# Patient Record
Sex: Male | Born: 1970 | Race: White | Hispanic: No | Marital: Married | Smoking: Current every day smoker
Health system: Southern US, Community
[De-identification: ages and names within clinical notes are randomized; demographics above are authoritative.]

## PROBLEM LIST (undated history)

## (undated) DIAGNOSIS — F410 Panic disorder [episodic paroxysmal anxiety] without agoraphobia: Secondary | ICD-10-CM

## (undated) DIAGNOSIS — F1010 Alcohol abuse, uncomplicated: Secondary | ICD-10-CM

## (undated) DIAGNOSIS — J449 Chronic obstructive pulmonary disease, unspecified: Secondary | ICD-10-CM

## (undated) DIAGNOSIS — I10 Essential (primary) hypertension: Secondary | ICD-10-CM

## (undated) DIAGNOSIS — F419 Anxiety disorder, unspecified: Secondary | ICD-10-CM

## (undated) HISTORY — DX: Chronic obstructive pulmonary disease, unspecified: J44.9

## (undated) HISTORY — PX: EYE SURGERY: SHX253

## (undated) HISTORY — PX: EXTERNAL EAR SURGERY: SHX627

---

## 1999-04-09 ENCOUNTER — Emergency Department (HOSPITAL_COMMUNITY): Admission: EM | Admit: 1999-04-09 | Discharge: 1999-04-09 | Payer: Self-pay | Admitting: Emergency Medicine

## 2000-04-13 ENCOUNTER — Emergency Department (HOSPITAL_COMMUNITY): Admission: EM | Admit: 2000-04-13 | Discharge: 2000-04-13 | Payer: Self-pay | Admitting: Emergency Medicine

## 2006-09-30 ENCOUNTER — Ambulatory Visit: Payer: Self-pay | Admitting: Psychiatry

## 2006-09-30 ENCOUNTER — Inpatient Hospital Stay (HOSPITAL_COMMUNITY): Admission: EM | Admit: 2006-09-30 | Discharge: 2006-10-03 | Payer: Self-pay | Admitting: Psychiatry

## 2007-01-25 ENCOUNTER — Ambulatory Visit: Payer: Self-pay | Admitting: Psychiatry

## 2007-01-25 ENCOUNTER — Inpatient Hospital Stay (HOSPITAL_COMMUNITY): Admission: RE | Admit: 2007-01-25 | Discharge: 2007-01-28 | Payer: Self-pay | Admitting: Psychiatry

## 2010-12-02 NOTE — Discharge Summary (Signed)
NAME:  BEECHER, FURIO NO.:  [id number]   MEDICAL RECORD NO.:  [id number]          PATIENT TYPE:  IPS   LOCATION:  0503                          FACILITY:  BH   PHYSICIAN:  Geoffery Lyons, M.D.      DATE OF BIRTH:  May 05, 1971   DATE OF ADMISSION:  09/30/2006  DATE OF DISCHARGE:  10/03/2006                               DISCHARGE SUMMARY   CHIEF COMPLAINT AND PRESENT ILLNESS:  This was the first admission to  [facility] for this 40 year old separated male who was  having thoughts about killing himself, trying to hang himself with a  leash, told his nurse that he would blow his head off.  When he  attempted to leave the hospital the patient was petitioned.  He had been  drinking 12 beers a night.  Prior to this admission he was drinking only  once a week.  Apparently his wife left him approximately a week ago and  he is currently unemployed.   PAST PSYCHIATRIC HISTORY:  The first time at [facility].  He has  been on Prozac in the past.   SOCIAL HISTORY:  Drinking approximately 12 beers at night.  No other  substances.   MEDICAL HISTORY:  Hypertension, gastroesophageal reflux.   MEDICATION:  Toprol XL 150, Ativan 1 mg twice a day.   PHYSICAL EXAMINATION:  Physical examination performed and failed to show  any acute findings.   LABORATORY WORK:  Sodium 142, potassium 3.6, glucose 85, BUN 5,  creatinine 0.93, SGOT 27, SGPT 30.  Drug screening negative for  substances of abuse.  CBC: White blood cells 8.0, hemoglobin 16.5.   MENTAL STATUS EXAM:  Reveals alert cooperative male somewhat sedated.  Thought process clear, no active delusions.  No suicidal or homicidal  ideas, no hallucinations.  Cognition well-preserved.   ADMISSION DIAGNOSIS:  AXIS I: Rule out major depressive disorder,  alcohol abuse.  AXIS II: No diagnosis.  AXIS III:  Hypertension.  Gastroesophageal reflux.  AXIS IV: Moderate.  AXIS V:  Upon admission 30, highest in  the last year 60.   COURSE IN THE HOSPITAL:  He was admitted.  He was started in individual  and group psychotherapy.  He endorsed he has been very depressed for the  last 3-4 days.  He is a Technical sales engineer, unemployed five or six  months, financially struggling.  His wife left 1 week prior to this  admission, very upset, conflict with the wife.  A 13-year relationship.  Has been apart for 2 weeks in the past.  Endorsed anger, rage, worries.  He was on Prozac a long time ago.  He felt that it initially sped him up  and then made him feel like a zombie.  Alcohol 6-12 beers on weekends.  Used to be a heavy drinker, did slow down.  No drugs.  By March 18 he  was better.  He found out that the wife was in the women's shelter.  Felt that he was ready to move on either with her or without her.  Committed to doing better.  Sleep  got better.  Mood improved.  Affect  was brighter.  Endorsed that he needed to get out and find a job.  On  March19 he was in full contact with reality.  There were no suicidal or  homicidal ideas, no hallucinations or delusions.  He was objectively  better.  He felt that he was ready to move on.  He was going to go home  with his parents.  Will work on himself, work on finding a job, get  stronger.  If his wife was to decide to come back he would be okay with  it, if not he was ready to move on.   DISCHARGE DIAGNOSES:  AXIS I: Major depression, anxiety disorder, not  otherwise specified.  Alcohol abuse.  AXIS II: No diagnosis.  AXIS III:  Hypertension, gastroesophageal reflux disease.  AXIS IV: Moderate.  AXIS V:  On discharge 55-60.   MEDICATIONS:  Discharged on Lexapro 10 mg per day, Ativan 1 mg twice a  day.  Decrease to 0.5 twice a day with the physicians.  Toprol XL 50 mg  per day.  To abstain from alcohol.   FOLLOW UP:  [facility] in [place].      Geoffery Lyons, M.D.  Electronically Signed     IL/MEDQ  D:  10/30/2006  T:  10/31/2006   Job:  [id number]

## 2010-12-02 NOTE — Discharge Summary (Signed)
NAME:  [name], [name] NO.:  [id number]   MEDICAL RECORD NO.:  [id number]          PATIENT TYPE:  IPS   LOCATION:  0505                          FACILITY:  BH   PHYSICIAN:  Anselm Jungling, MD  DATE OF BIRTH:  04-17-1971   DATE OF ADMISSION:  01/25/2007  DATE OF DISCHARGE:  01/28/2007                               DISCHARGE SUMMARY   IDENTIFYING DATA/REASON FOR ADMISSION:  This was an inpatient  psychiatric admission for Vincent Johnson, a 40 year old male who was admitted due  to depression, in the aftermath of an overdose.  He presented with  joblessness, and financial problems.  He had been living with his  parents.  His spouse had abandoned the home two weeks prior.  He also  admitted to alcohol abuse.  Please refer to the admission note for  further details pertaining to the symptoms, circumstances and history  that led to his hospitalization.   INITIAL DIAGNOSTIC IMPRESSION:  He was given initial AXIS I diagnoses of  polysubstance dependence and rule out depressive disorder not otherwise  specified.   MEDICAL/LABORATORY:  The patient was medically and physically assessed  by the psychiatric nurse practitioner.  There were no acute medical  issues.  He did appear to have an ear infection and was given  Cortisporin otic drops twice daily, and instructed to continue this  through February 05, 2007.   HOSPITAL COURSE:  The patient was admitted to the adult inpatient  service.  He presented as a disheveled, but cooperative male who readily  discussed the stressors in his life.  He denied any further suicidal  ideation and verbalized a strong desire for help.   He was placed on a Librium detoxification protocol.  He was involved in  various therapeutic groups and activities including those geared towards  12-step recovery.   He did relatively well with his detoxification protocol, but on the  fourth hospital day indicated in eagerness to leave the inpatient  setting.   He denied suicidal ideation.  We had talked of having a family  conference with his parents and brother prior to discharge, but he was  unwilling to wait for this to occur.  He was discharged earlier than we  would have preferred, at his own request.   AFTERCARE:  The patient was to follow up at the [facility] with  an appointment on the following day, January 29, 2007.   DISCHARGE MEDICATIONS:  Cortisporin otic drops, 3 drops each ear twice  daily through February 05, 2007.  The patient was instructed to follow up  with his family doctor for any medical issues.   DISCHARGE DIAGNOSES:  AXIS I:  Polysubstance dependence.  Mood disorder  not otherwise specified.  AXIS II:  Deferred.  AXIS III:  Ear infection.  AXIS IV:  Stressors:  Severe.  AXIS V:  GAF on discharge 50.      Anselm Jungling, MD  Electronically Signed     SPB/MEDQ  D:  02/15/2007  T:  02/15/2007  Job:  [id number]

## 2010-12-02 NOTE — H&P (Signed)
NAME:  Vincent Johnson, Vincent Johnson NO.:  [id number]   MEDICAL RECORD NO.:  [id number]          PATIENT TYPE:  IPS   LOCATION:  0503                          FACILITY:  BH   PHYSICIAN:  Geoffery Lyons, M.D.      DATE OF BIRTH:  [birth year]   DATE OF ADMISSION:  09/30/2006  DATE OF DISCHARGE:                       PSYCHIATRIC ADMISSION ASSESSMENT   IDENTIFYING INFORMATION:  The patient is a 40 year old separated  involuntary committed on September 30, 2006.  The patient is here on  petition.  Papers state that the patient was having thoughts about  killing himself, trying to hang himself with a leash, told his nurse  that he would blow his head off.  When he attempted to leave the  hospital, the patient was petitioned.  The patient has been drinking  approximately 12 beers the night prior to this admission otherwise  drinking is only once a week.  The patient's stressors are his wife left  him approximately a week ago and he is currently unemployed.   PAST PSYCHIATRIC HISTORY:  First admission to the [facility].  Was on Prozac in the past.   SOCIAL HISTORY:  This is a 41 year old separated white male who  currently lives with his mother.  He has been married for 13 years.  He  is unemployed.  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   FAMILY HISTORY:  None.   ALCOHOL/DRUG HISTORY:  Alcohol and drug habits as above.  There is no  current drug use.   PRIMARY CARE PHYSICIAN:  Unclear.   MEDICAL PROBLEMS:  Hypertension and reflux.   MEDICATIONS:  Has been on Toprol XL 150 mg daily and Ativan 1 mg b.i.d.   ALLERGIES:  No known allergies.   PHYSICAL EXAMINATION:  The patient was fully assessed at [facility]  Temperature is 97.9, heart rate 139, respirations 20, blood  pressure 172/120, with weight of 189 pounds.   LABORATORY DATA:  Alcohol level is 200.  CMET is within normal limits.  Urine drug screen was negative.  CBC within normal limits.  Acetaminophen level less than 10.   MENTAL STATUS EXAM:  He is in the bed.  He is sedated, very difficult to  arouse.  His thought processes and mood and cognitive function are  unable to ascertain this time.   DIAGNOSES:  AXIS I:  Major depressive disorder.  Anxiety disorder.  Rule  out alcohol abuse.  AXIS II:  Deferred.  AXIS III:  Hypertension, reflux.  AXIS IV:  Problems with primary support group, occupation, possible  financial problems, other psychosocial problems and medical problems.  AXIS V:  Current 30.   PLAN:  Contract for safety.  Stabilize mood and thinking.  We will at  this time continue with his Ativan and continue to monitor the patient's  substance abuse.  Will initiate a small dose of Lexapro as the patient  had some concerns about SSRIs.  Will consider a family session with his  wife.  The patient is to follow up with his primary care Madeliene Tejera in  regards to  his medical problems.   TENTATIVE LENGTH OF STAY:  Five to seven days.      Landry Corporal, N.P.      Geoffery Lyons, M.D.  Electronically Signed    JO/MEDQ  D:  10/02/2006  T:  10/02/2006  Job:  [id number]

## 2012-02-26 ENCOUNTER — Emergency Department (HOSPITAL_COMMUNITY)
Admission: EM | Admit: 2012-02-26 | Discharge: 2012-02-26 | Disposition: A | Discharge status: Home / Self Care | Payer: Medicare Other | Attending: Emergency Medicine | Admitting: Emergency Medicine

## 2012-02-26 ENCOUNTER — Encounter (HOSPITAL_COMMUNITY): Payer: Self-pay | Admitting: Family Medicine

## 2012-02-26 DIAGNOSIS — I10 Essential (primary) hypertension: Secondary | ICD-10-CM | POA: Insufficient documentation

## 2012-02-26 DIAGNOSIS — F1320 Sedative, hypnotic or anxiolytic dependence, uncomplicated: Secondary | ICD-10-CM | POA: Insufficient documentation

## 2012-02-26 DIAGNOSIS — F1310 Sedative, hypnotic or anxiolytic abuse, uncomplicated: Secondary | ICD-10-CM

## 2012-02-26 DIAGNOSIS — F411 Generalized anxiety disorder: Secondary | ICD-10-CM | POA: Insufficient documentation

## 2012-02-26 DIAGNOSIS — F419 Anxiety disorder, unspecified: Secondary | ICD-10-CM

## 2012-02-26 DIAGNOSIS — F17200 Nicotine dependence, unspecified, uncomplicated: Secondary | ICD-10-CM | POA: Insufficient documentation

## 2012-02-26 HISTORY — DX: Essential (primary) hypertension: I10

## 2012-02-26 HISTORY — DX: Panic disorder (episodic paroxysmal anxiety): F41.0

## 2012-02-26 MED ORDER — LORAZEPAM 1 MG PO TABS
2.0000 mg | ORAL_TABLET | Freq: Once | ORAL | Status: AC
  Administered 2012-02-26: 2 mg via ORAL
  Filled 2012-02-26: qty 2

## 2012-02-26 MED ORDER — CLONAZEPAM 0.5 MG PO TABS: 0.5000 mg | ORAL_TABLET | Freq: Two times a day (BID) | ORAL | Status: DC | PRN

## 2012-02-26 NOTE — ED Notes (Signed)
Pt reports having panic attacks one after another this morning. Reports this is his third day without ativan.  Denies SI/HI or hallucinations at this time.

## 2012-02-26 NOTE — ED Provider Notes (Signed)
History     CSN: [id number]  Arrival date & time 02/26/12  1342   First MD Initiated Contact with Patient 02/26/12 1555      Chief Complaint  Patient presents with  . Withdrawal    from ativan  . Anxiety    (Consider location/radiation/quality/duration/timing/severity/associated sxs/prior treatment) HPI  Patient presents to the emergency department requesting medication refill. He states that he is out of his Ativan and it has been 3 or 4 days since her last period he states that he is having withdrawal symptoms of feeling really anxious and shaky. He denies having any hallucinations, vomiting, seizures, headaches. He says that his anxiety has been really bad therefore he has been taking more than what is prescribed to him. He states that he gets that from his primary care Doctor. He admits that his refill is deal on Saturday when he will be able to get some more Ativan. He states that he is taking it for 10 years. He denies using any other daily medications for anxiety. He denies having any other symptoms. Vital signs WNL.   Past Medical History  Diagnosis Date  . Panic attacks   . Hypertension     History reviewed. No pertinent past surgical history.  History reviewed. No pertinent family history.  History  Substance Use Topics  . Smoking status: Current Everyday Smoker -- 1.0 packs/day  . Smokeless tobacco: Not on file  . Alcohol Use: Yes      Review of Systems   HEENT: denies blurry vision or change in hearing PULMONARY: Denies difficulty breathing and SOB CARDIAC: denies chest pain or heart palpitations MUSCULOSKELETAL:  denies being unable to ambulate ABDOMEN AL: denies abdominal pain GU: denies loss of bowel or urinary control NEURO: denies numbness and tingling in extremities SKIN: no new rashes PSYCH: +anxiety. NECK: Pt denies having neck pain    Allergies  Review of patient's allergies indicates no known allergies.  Home Medications   Current  Outpatient Rx  Name Route Sig Dispense Refill  . DIPHENHYDRAMINE HCL 25 MG PO TABS Oral Take 25 mg by mouth every 6 (six) hours as needed. Sleep.    Marland Kitchen LISINOPRIL-HYDROCHLOROTHIAZIDE 20-12.5 MG PO TABS Oral Take 1 tablet by mouth daily.    Marland Kitchen LORAZEPAM 2 MG PO TABS Oral Take 2 mg by mouth every 8 (eight) hours as needed. Anxiety.    Marland Kitchen METOPROLOL TARTRATE 100 MG PO TABS Oral Take 100 mg by mouth 2 (two) times daily.    Marland Kitchen OMEPRAZOLE 20 MG PO CPDR Oral Take 20 mg by mouth daily.    Marland Kitchen CLONAZEPAM 0.5 MG PO TABS Oral Take 1 tablet (0.5 mg total) by mouth 2 (two) times daily as needed for anxiety. 9 tablet 0    BP 128/85  Pulse 73  Temp 97.5 F (36.4 C) (Oral)  Resp 18  SpO2 100%  Physical Exam  Nursing note and vitals reviewed. Constitutional: He appears well-developed and well-nourished. No distress.  HENT:  Head: Normocephalic and atraumatic.  Eyes: Pupils are equal, round, and reactive to light.  Neck: Normal range of motion. Neck supple.  Cardiovascular: Normal rate and regular rhythm.   Pulmonary/Chest: Effort normal.  Abdominal: Soft.  Neurological: He is alert.  Skin: Skin is warm and dry.  Psychiatric: His mood appears anxious. He does not exhibit a depressed mood. He expresses no homicidal and no suicidal ideation. He expresses no suicidal plans and no homicidal plans.    ED Course  Procedures (including critical  care time)  Labs Reviewed - No data to display No results found.   1. Anxiety   2. Benzodiazepine abuse       MDM  Patient has been off of his Ativan for almost 4 days. He is not having any true withdrawal symptoms at this time. I have written him a prescription for Klonopin to help him get to Saturday when he hit his refill. He stated that he cannot take his medication more often or a higher dose than what is prescribed. I have discussed with him that he needs to talk to the prescribing physician and let him know that C. needs adjunct therapy as his regimen  now is not sufficient. Patient is agreeable with this understanding. No red flags symptoms noted.  Pt has been advised of the symptoms that warrant their return to the ED. Patient has voiced understanding and has agreed to follow-up with the PCP or specialist.         Dorthula Matas, PA 02/26/12 1623

## 2012-02-27 NOTE — ED Provider Notes (Signed)
Medical screening examination/treatment/procedure(s) were performed by non-physician practitioner and as supervising physician I was immediately available for consultation/collaboration.   Anacleto Batterman M Rosevelt Luu, DO 02/27/12 1521 

## 2015-08-10 DIAGNOSIS — E669 Obesity, unspecified: Secondary | ICD-10-CM | POA: Diagnosis not present

## 2015-08-10 DIAGNOSIS — E1165 Type 2 diabetes mellitus with hyperglycemia: Secondary | ICD-10-CM | POA: Diagnosis not present

## 2015-08-10 DIAGNOSIS — E039 Hypothyroidism, unspecified: Secondary | ICD-10-CM | POA: Diagnosis not present

## 2015-08-10 DIAGNOSIS — I10 Essential (primary) hypertension: Secondary | ICD-10-CM | POA: Diagnosis not present

## 2015-08-10 DIAGNOSIS — F411 Generalized anxiety disorder: Secondary | ICD-10-CM | POA: Diagnosis not present

## 2015-08-10 DIAGNOSIS — E790 Hyperuricemia without signs of inflammatory arthritis and tophaceous disease: Secondary | ICD-10-CM | POA: Diagnosis not present

## 2015-08-10 DIAGNOSIS — E782 Mixed hyperlipidemia: Secondary | ICD-10-CM | POA: Diagnosis not present

## 2015-08-10 DIAGNOSIS — R252 Cramp and spasm: Secondary | ICD-10-CM | POA: Diagnosis not present

## 2015-09-07 DIAGNOSIS — G8929 Other chronic pain: Secondary | ICD-10-CM | POA: Diagnosis not present

## 2015-09-07 DIAGNOSIS — E559 Vitamin D deficiency, unspecified: Secondary | ICD-10-CM | POA: Diagnosis not present

## 2015-09-07 DIAGNOSIS — J309 Allergic rhinitis, unspecified: Secondary | ICD-10-CM | POA: Diagnosis not present

## 2015-09-07 DIAGNOSIS — I10 Essential (primary) hypertension: Secondary | ICD-10-CM | POA: Diagnosis not present

## 2015-09-07 DIAGNOSIS — F411 Generalized anxiety disorder: Secondary | ICD-10-CM | POA: Diagnosis not present

## 2015-10-05 DIAGNOSIS — F411 Generalized anxiety disorder: Secondary | ICD-10-CM | POA: Diagnosis not present

## 2015-10-05 DIAGNOSIS — I10 Essential (primary) hypertension: Secondary | ICD-10-CM | POA: Diagnosis not present

## 2015-10-05 DIAGNOSIS — E669 Obesity, unspecified: Secondary | ICD-10-CM | POA: Diagnosis not present

## 2015-10-05 DIAGNOSIS — G8929 Other chronic pain: Secondary | ICD-10-CM | POA: Diagnosis not present

## 2015-11-03 DIAGNOSIS — I10 Essential (primary) hypertension: Secondary | ICD-10-CM | POA: Diagnosis not present

## 2015-11-03 DIAGNOSIS — G8929 Other chronic pain: Secondary | ICD-10-CM | POA: Diagnosis not present

## 2015-11-03 DIAGNOSIS — E039 Hypothyroidism, unspecified: Secondary | ICD-10-CM | POA: Diagnosis not present

## 2015-11-03 DIAGNOSIS — K219 Gastro-esophageal reflux disease without esophagitis: Secondary | ICD-10-CM | POA: Diagnosis not present

## 2015-11-03 DIAGNOSIS — R05 Cough: Secondary | ICD-10-CM | POA: Diagnosis not present

## 2015-11-03 DIAGNOSIS — R252 Cramp and spasm: Secondary | ICD-10-CM | POA: Diagnosis not present

## 2015-11-03 DIAGNOSIS — F17210 Nicotine dependence, cigarettes, uncomplicated: Secondary | ICD-10-CM | POA: Diagnosis not present

## 2015-11-03 DIAGNOSIS — J069 Acute upper respiratory infection, unspecified: Secondary | ICD-10-CM | POA: Diagnosis not present

## 2015-11-03 DIAGNOSIS — E790 Hyperuricemia without signs of inflammatory arthritis and tophaceous disease: Secondary | ICD-10-CM | POA: Diagnosis not present

## 2015-11-04 DIAGNOSIS — E1165 Type 2 diabetes mellitus with hyperglycemia: Secondary | ICD-10-CM | POA: Diagnosis not present

## 2015-11-04 DIAGNOSIS — E782 Mixed hyperlipidemia: Secondary | ICD-10-CM | POA: Diagnosis not present

## 2015-11-04 DIAGNOSIS — I10 Essential (primary) hypertension: Secondary | ICD-10-CM | POA: Diagnosis not present

## 2015-12-17 DIAGNOSIS — I10 Essential (primary) hypertension: Secondary | ICD-10-CM | POA: Diagnosis not present

## 2015-12-17 DIAGNOSIS — F419 Anxiety disorder, unspecified: Secondary | ICD-10-CM | POA: Diagnosis not present

## 2015-12-21 DIAGNOSIS — F419 Anxiety disorder, unspecified: Secondary | ICD-10-CM | POA: Diagnosis not present

## 2015-12-21 DIAGNOSIS — Z760 Encounter for issue of repeat prescription: Secondary | ICD-10-CM | POA: Diagnosis not present

## 2016-02-16 ENCOUNTER — Ambulatory Visit (HOSPITAL_COMMUNITY)
Admission: EM | Admit: 2016-02-16 | Discharge: 2016-02-16 | Disposition: A | Discharge status: Home / Self Care | Payer: Medicare Other | Attending: Family Medicine | Admitting: Family Medicine

## 2016-02-16 ENCOUNTER — Encounter (HOSPITAL_COMMUNITY): Payer: Self-pay | Admitting: Emergency Medicine

## 2016-02-16 DIAGNOSIS — R0602 Shortness of breath: Secondary | ICD-10-CM | POA: Diagnosis not present

## 2016-02-16 DIAGNOSIS — Z760 Encounter for issue of repeat prescription: Secondary | ICD-10-CM

## 2016-02-16 MED ORDER — LORAZEPAM 1 MG PO TABS: 1.0000 mg | ORAL_TABLET | Freq: Three times a day (TID) | ORAL | 0 refills | Status: DC | PRN

## 2016-02-16 MED ORDER — TRIAMCINOLONE ACETONIDE 40 MG/ML IJ SUSP
40.0000 mg | Freq: Once | INTRAMUSCULAR | Status: AC
  Administered 2016-02-16: 40 mg via INTRAMUSCULAR

## 2016-02-16 MED ORDER — TRIAMCINOLONE ACETONIDE 40 MG/ML IJ SUSP
INTRAMUSCULAR | Status: AC
  Filled 2016-02-16: qty 1

## 2016-02-16 MED ORDER — METHYLPREDNISOLONE ACETATE 80 MG/ML IJ SUSP
INTRAMUSCULAR | Status: AC
Start: 2016-02-16 — End: 2016-02-16
  Filled 2016-02-16: qty 1

## 2016-02-16 MED ORDER — METHYLPREDNISOLONE ACETATE 40 MG/ML IJ SUSP
80.0000 mg | Freq: Once | INTRAMUSCULAR | Status: AC
  Administered 2016-02-16: 80 mg via INTRAMUSCULAR

## 2016-02-16 NOTE — Discharge Instructions (Signed)
See your doctor at [facility] for further  refills.

## 2016-02-16 NOTE — ED Provider Notes (Signed)
[place]    CSN: [id number] Arrival date & time: 02/16/16  1131  First Provider Contact:  First MD Initiated Contact with Patient 02/16/16 1301        History   Chief Complaint Chief Complaint  Patient presents with  . Shortness of Breath  . Medication Refill    HPI Vincent Johnson is a 45 y.o. male.   The history is provided by the patient.  Shortness of Breath  Severity:  Mild Onset quality:  Gradual Timing:  Intermittent Progression:  Unchanged Chronicity:  Chronic Context: weather changes   Relieved by:  Nothing Worsened by:  Nothing Associated symptoms: no fever and no wheezing   Associated symptoms comment:  Pt without lmd and is now out of meds, has mult med issues .requesting all of meds but told needs to come from primary doctor.  Medication Refill    Past Medical History:  Diagnosis Date  . Hypertension   . Panic attacks     There are no active problems to display for this patient.   No past surgical history on file.     Home Medications    Prior to Admission medications   Medication Sig Start Date End Date Taking? Authorizing Provider  clonazePAM (KLONOPIN) 0.5 MG tablet Take 1 tablet (0.5 mg total) by mouth 2 (two) times daily as needed for anxiety. 02/26/12 03/27/12  Tiffany Carlota Raspberry, PA-C  diphenhydrAMINE (BENADRYL) 25 MG tablet Take 25 mg by mouth every 6 (six) hours as needed. Sleep.    Historical Provider, MD  lisinopril-hydrochlorothiazide (PRINZIDE,ZESTORETIC) 20-12.5 MG per tablet Take 1 tablet by mouth daily.    Historical Provider, MD  LORazepam (ATIVAN) 2 MG tablet Take 2 mg by mouth every 8 (eight) hours as needed. Anxiety.    Historical Provider, MD  metoprolol (LOPRESSOR) 100 MG tablet Take 100 mg by mouth 2 (two) times daily.    Historical Provider, MD  omeprazole (PRILOSEC) 20 MG capsule Take 20 mg by mouth daily.    Historical Provider, MD    Family History No family history on file.  Social History Social History   Substance Use Topics  . Smoking status: Current Every Day Smoker    Packs/day: 1.00  . Smokeless tobacco: Not on file  . Alcohol use Yes     Allergies   Review of patient's allergies indicates no known allergies.   Review of Systems Review of Systems  Constitutional: Negative for fever.  HENT: Negative.   Respiratory: Positive for shortness of breath. Negative for wheezing.   Cardiovascular: Negative.   Psychiatric/Behavioral: The patient is nervous/anxious.   All other systems reviewed and are negative.    Physical Exam Triage Vital Signs ED Triage Vitals [02/16/16 1242]  Enc Vitals Group     BP 139/83     Pulse Rate 72     Resp 16     Temp 98.2 F (36.8 C)     Temp Source Oral     SpO2 95 %     Weight      Height      Head Circumference      Peak Flow      Pain Score      Pain Loc      Pain Edu?      Excl. in [place]?    No data found.   Updated Vital Signs BP 139/83 (BP Location: Left Arm)   Pulse 72   Temp 98.2 F (36.8 C) (Oral)   Resp 16  SpO2 95%   Visual Acuity Right Eye Distance:   Left Eye Distance:   Bilateral Distance:    Right Eye Near:   Left Eye Near:    Bilateral Near:     Physical Exam  Constitutional: He is oriented to person, place, and time. He appears well-developed and well-nourished.  Neck: Normal range of motion. Neck supple.  Cardiovascular: Normal rate, regular rhythm, normal heart sounds and intact distal pulses.   Pulmonary/Chest: Effort normal and breath sounds normal.  Lymphadenopathy:    He has no cervical adenopathy.  Neurological: He is alert and oriented to person, place, and time.  Skin: Skin is warm and dry.  Nursing note and vitals reviewed.    UC Treatments / Results  Labs (all labs ordered are listed, but only abnormal results are displayed) Labs Reviewed - No data to display  EKG  EKG Interpretation None       Radiology No results found.  Procedures Procedures (including critical care  time)  Medications Ordered in UC Medications - No data to display   Initial Impression / Assessment and Plan / UC Course  I have reviewed the triage vital signs and the nursing notes.  Pertinent labs & imaging results that were available during my care of the patient were reviewed by me and considered in my medical decision making (see chart for details).  Clinical Course      Final Clinical Impressions(s) / UC Diagnoses   Final diagnoses:  None    New Prescriptions New Prescriptions   No medications on file     Billy Fischer, MD 02/16/16 1352

## 2016-02-16 NOTE — ED Triage Notes (Signed)
The patient presented to the [facility] with a complaint of shortness of breath that he attributes to a possible URI. The patient also requested multiple medications to be refilled.

## 2016-02-17 ENCOUNTER — Ambulatory Visit (INDEPENDENT_AMBULATORY_CARE_PROVIDER_SITE_OTHER): Payer: Medicare Other | Admitting: Physician Assistant

## 2016-02-17 VITALS — BP 122/84 | HR 92 | Temp 97.8°F | Resp 18 | Ht 68.0 in | Wt 209.0 lb

## 2016-02-17 DIAGNOSIS — Z7189 Other specified counseling: Secondary | ICD-10-CM | POA: Diagnosis not present

## 2016-02-17 DIAGNOSIS — Z7689 Persons encountering health services in other specified circumstances: Secondary | ICD-10-CM

## 2016-02-17 DIAGNOSIS — J449 Chronic obstructive pulmonary disease, unspecified: Secondary | ICD-10-CM | POA: Diagnosis not present

## 2016-02-17 MED ORDER — METOPROLOL TARTRATE 100 MG PO TABS: 100.0000 mg | ORAL_TABLET | Freq: Two times a day (BID) | ORAL | 3 refills | Status: AC

## 2016-02-17 MED ORDER — ALBUTEROL SULFATE HFA 108 (90 BASE) MCG/ACT IN AERS: 2.0000 | INHALATION_SPRAY | Freq: Four times a day (QID) | RESPIRATORY_TRACT | 0 refills | Status: DC | PRN

## 2016-02-17 NOTE — Progress Notes (Addendum)
02/17/2016 2:15 PM   DOB: [birth year] / MRN: [id number]  SUBJECTIVE:  Vincent Johnson is a 45 y.o. male presenting to establish care.  He is disabled due to several psychological conditions. He was getting his medication in [place]. He takes three Lorazepam 1 mg tablet.  He did receive this from Dr. Melanee Spry yesterday and did receive some refill.  He reports trying an SSRI in the past and this made him manic.   He does report to me today that if he can't get his Lorazepam and he will just drink alcohol.   He is a 30 year smoker.  He smokes a pack a day and has been doing so for quite some time.    He has a history of COPD and has not seen a pulmonologist.    He has No Known Allergies.   He  has a past medical history of COPD (chronic obstructive pulmonary disease) ([place]); Hypertension; and Panic attacks.    He  reports that he has been smoking.  He has been smoking about 1.00 pack per day. He does not have any smokeless tobacco history on file. He reports that he drinks alcohol. He reports that he does not use drugs. He  has no sexual activity history on file. The patient  has no past surgical history on file.  His family history is not on file.  Review of Systems  Constitutional: Negative for fever.  Respiratory: Negative for cough.   Cardiovascular: Negative for chest pain.  Neurological: Negative for dizziness.    The problem list and medications were reviewed and updated by myself where necessary and exist elsewhere in the encounter.   OBJECTIVE:  BP 122/84   Pulse 92   Temp 97.8 F (36.6 C) (Oral)   Resp 18   Ht 5\' 8"  (1.727 m)   Wt 209 lb (94.8 kg)   SpO2 94%   BMI 31.78 kg/m   Physical Exam  Constitutional: He is oriented to person, place, and time. He appears well-developed and well-nourished. No distress.  Cardiovascular: Normal rate and regular rhythm.   Pulmonary/Chest: Effort normal and breath sounds normal.  Neurological: He is alert and oriented to person,  place, and time.  Skin: Skin is warm and dry. He is not diaphoretic.    No results found for this or any previous visit (from the past 72 hour(s)).  No results found.  ASSESSMENT AND PLAN  [name] was seen today for establish care.  Diagnoses and all orders for this visit:  Encounter to establish care: It seems to me that he is here to establish for chronic benzodiazepine therapy.  I have informed him that I can not and will not do this and he will need to go to psychiatry as he needs a diagnosis.  I have given him instructions on how to taper his benzo and advised he present to the ED if he begins to withdrawal (see AVS). I have referred to psychiatry and advised this is his only option.   -     Ambulatory referral to Psychiatry -     metoprolol (LOPRESSOR) 100 MG tablet; Take 1 tablet (100 mg total) by mouth 2 (two) times daily.  Chronic obstructive pulmonary disease, unspecified COPD type ([place]): Will see him back in one month to get this worked up.  He needs an alpha antitripsin and PFT's.  -     albuterol (PROVENTIL HFA;VENTOLIN HFA) 108 (90 Base) MCG/ACT inhaler; Inhale 2 puffs into the lungs every 6 (  six) hours as needed for wheezing or shortness of breath.    The patient is advised to call or return to clinic if he does not see an improvement in symptoms, or to seek the care of the closest emergency department if he worsens with the above plan.   Philis Fendt, MHS, PA-C [facility] 02/17/2016 2:15 PM

## 2016-02-17 NOTE — Patient Instructions (Addendum)
To taper off of your lorazepam take 1 mg three times daily for 2 days, then take 0.5 mg 3 times daily for 2 days, then take 0.5 mg twice daily for two days, then 0.5 mg twice daily for three days, then take 0.5 mg once daily until finished. If at any time you begin to feel sweaty, nauseous, or can't sleep as you normally do the go to the ED.   Please go to psychiatry.   Please come back in one month for a COPD follow up.     IF you received an x-ray today, you will receive an invoice from [facility]. Please contact [facility] at [phone] with questions or concerns regarding your invoice.   IF you received labwork today, you will receive an invoice from [employer]. Please contact [facility] at [phone] with questions or concerns regarding your invoice.   Our billing staff will not be able to assist you with questions regarding bills from these companies.  You will be contacted with the lab results as soon as they are available. The fastest way to get your results is to activate your My Chart account. Instructions are located on the last page of this paperwork. If you have not heard from [place] regarding the results in 2 weeks, please contact this office.

## 2016-02-28 DIAGNOSIS — F411 Generalized anxiety disorder: Secondary | ICD-10-CM | POA: Diagnosis not present

## 2016-02-28 DIAGNOSIS — I10 Essential (primary) hypertension: Secondary | ICD-10-CM | POA: Diagnosis not present

## 2016-02-28 DIAGNOSIS — J209 Acute bronchitis, unspecified: Secondary | ICD-10-CM | POA: Diagnosis not present

## 2016-03-13 DIAGNOSIS — R079 Chest pain, unspecified: Secondary | ICD-10-CM | POA: Diagnosis not present

## 2016-03-13 DIAGNOSIS — R0789 Other chest pain: Secondary | ICD-10-CM | POA: Diagnosis not present

## 2016-03-13 DIAGNOSIS — R109 Unspecified abdominal pain: Secondary | ICD-10-CM | POA: Diagnosis not present

## 2016-03-27 DIAGNOSIS — I10 Essential (primary) hypertension: Secondary | ICD-10-CM | POA: Diagnosis not present

## 2016-03-27 DIAGNOSIS — F411 Generalized anxiety disorder: Secondary | ICD-10-CM | POA: Diagnosis not present

## 2016-04-19 ENCOUNTER — Encounter (HOSPITAL_COMMUNITY): Payer: Self-pay

## 2016-04-19 ENCOUNTER — Emergency Department (HOSPITAL_COMMUNITY)
Admission: EM | Admit: 2016-04-19 | Discharge: 2016-04-19 | Disposition: A | Discharge status: Home / Self Care | Payer: Medicare Other | Attending: Emergency Medicine | Admitting: Emergency Medicine

## 2016-04-19 DIAGNOSIS — Z79899 Other long term (current) drug therapy: Secondary | ICD-10-CM | POA: Insufficient documentation

## 2016-04-19 DIAGNOSIS — F17200 Nicotine dependence, unspecified, uncomplicated: Secondary | ICD-10-CM | POA: Insufficient documentation

## 2016-04-19 DIAGNOSIS — I10 Essential (primary) hypertension: Secondary | ICD-10-CM | POA: Insufficient documentation

## 2016-04-19 DIAGNOSIS — J449 Chronic obstructive pulmonary disease, unspecified: Secondary | ICD-10-CM | POA: Insufficient documentation

## 2016-04-19 DIAGNOSIS — Z760 Encounter for issue of repeat prescription: Secondary | ICD-10-CM | POA: Diagnosis not present

## 2016-04-19 HISTORY — DX: Anxiety disorder, unspecified: F41.9

## 2016-04-19 MED ORDER — LORAZEPAM 1 MG PO TABS
2.0000 mg | ORAL_TABLET | Freq: Once | ORAL | Status: AC
  Administered 2016-04-19: 2 mg via ORAL
  Filled 2016-04-19: qty 2

## 2016-04-19 NOTE — ED Triage Notes (Addendum)
Pt here with need for ativan.  Pt has anxiety and panic disorder.  Pt states med should not have ran out.  Thinks roommate may have taken.  Has been out x 4 days.  Pt sees MD next week.  Not sleeping

## 2016-04-19 NOTE — Discharge Instructions (Signed)
Please follow up with your primary care provider for further medication refills.  Return to ER for new or worsening symptoms, any additional concerns.
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
## 2016-04-19 NOTE — ED Notes (Signed)
Bed: [facility]3 Expected date:  Expected time:  Means of arrival:  Comments: 

## 2016-04-25 DIAGNOSIS — I10 Essential (primary) hypertension: Secondary | ICD-10-CM | POA: Diagnosis not present

## 2016-04-25 DIAGNOSIS — F411 Generalized anxiety disorder: Secondary | ICD-10-CM | POA: Diagnosis not present

## 2016-05-19 DIAGNOSIS — F410 Panic disorder [episodic paroxysmal anxiety] without agoraphobia: Secondary | ICD-10-CM | POA: Diagnosis not present

## 2016-05-25 DIAGNOSIS — F410 Panic disorder [episodic paroxysmal anxiety] without agoraphobia: Secondary | ICD-10-CM | POA: Diagnosis not present

## 2016-06-06 ENCOUNTER — Encounter (HOSPITAL_COMMUNITY): Payer: Self-pay

## 2016-06-06 DIAGNOSIS — R51 Headache: Secondary | ICD-10-CM | POA: Diagnosis not present

## 2016-06-06 DIAGNOSIS — Z5321 Procedure and treatment not carried out due to patient leaving prior to being seen by health care provider: Secondary | ICD-10-CM | POA: Insufficient documentation

## 2016-06-06 NOTE — ED Triage Notes (Signed)
Pt states hx of migraines but getting more frequent; pt states migraine started about 3 days ago; Pt c/o 9/10 on arrival. Pt a&o x 4 on arrival.

## 2016-06-07 ENCOUNTER — Emergency Department (HOSPITAL_COMMUNITY)
Admission: EM | Admit: 2016-06-07 | Discharge: 2016-06-07 | Disposition: A | Discharge status: Home / Self Care | Payer: Medicare Other | Attending: Dermatology | Admitting: Dermatology

## 2016-06-07 DIAGNOSIS — R51 Headache: Secondary | ICD-10-CM

## 2016-06-07 DIAGNOSIS — R519 Headache, unspecified: Secondary | ICD-10-CM

## 2016-06-18 ENCOUNTER — Ambulatory Visit (HOSPITAL_COMMUNITY)
Admission: EM | Admit: 2016-06-18 | Discharge: 2016-06-18 | Disposition: A | Discharge status: Home / Self Care | Payer: Medicare Other | Attending: Emergency Medicine | Admitting: Emergency Medicine

## 2016-06-18 ENCOUNTER — Encounter (HOSPITAL_COMMUNITY): Payer: Self-pay | Admitting: *Deleted

## 2016-06-18 DIAGNOSIS — J329 Chronic sinusitis, unspecified: Secondary | ICD-10-CM

## 2016-06-18 DIAGNOSIS — J449 Chronic obstructive pulmonary disease, unspecified: Secondary | ICD-10-CM | POA: Diagnosis not present

## 2016-06-18 DIAGNOSIS — J9801 Acute bronchospasm: Secondary | ICD-10-CM

## 2016-06-18 DIAGNOSIS — R0982 Postnasal drip: Secondary | ICD-10-CM

## 2016-06-18 DIAGNOSIS — H60333 Swimmer's ear, bilateral: Secondary | ICD-10-CM

## 2016-06-18 HISTORY — DX: Alcohol abuse, uncomplicated: F10.10

## 2016-06-18 MED ORDER — PREDNISONE 50 MG PO TABS: ORAL_TABLET | ORAL | 0 refills | Status: DC

## 2016-06-18 MED ORDER — NEOMYCIN-POLYMYXIN-HC 3.5-10000-1 OT SUSP: 4.0000 [drp] | Freq: Four times a day (QID) | OTIC | 0 refills | Status: AC

## 2016-06-18 MED ORDER — TRAMADOL HCL 50 MG PO TABS: 50.0000 mg | ORAL_TABLET | Freq: Four times a day (QID) | ORAL | 0 refills | Status: DC | PRN

## 2016-06-18 MED ORDER — ALBUTEROL SULFATE HFA 108 (90 BASE) MCG/ACT IN AERS: 2.0000 | INHALATION_SPRAY | RESPIRATORY_TRACT | 0 refills | Status: AC | PRN

## 2016-06-18 NOTE — Discharge Instructions (Signed)
Take your medication as directed. Make sure you are using the albuterol inhaler correctly, it should help open your airways and help you breathe better and decrease cough. Stop smoking. Drink plenty of fluids and stay well-hydrated. Take the following medications as needed for upper respiratory infection symptoms. Sudafed PE 10 mg every 4 to 6 hours as needed for congestion Allegra or Zyrtec daily as needed for drainage and runny nose. For stronger antihistamine may take Chlor-Trimeton 2 to 4 mg every 4 to 6 hours, may cause drowsiness. Saline nasal spray used frequently. Ibuprofen 600 mg every 6 hours as needed for pain, discomfort or fever. Drink plenty of fluids and stay well-hydrated.

## 2016-06-18 NOTE — ED Triage Notes (Signed)
Also c/o bilat ear drainage and right earache.

## 2016-06-18 NOTE — ED Triage Notes (Signed)
Has also been out of Ativan x 24 hrs; has Rx to pick up.

## 2016-06-18 NOTE — ED Triage Notes (Signed)
C/O bad HA x 10 days, productive cough x 5 days.  Has felt feverish, achey.  Has been trying Excedrin Migraine.Marland Kitchen

## 2016-06-18 NOTE — ED Provider Notes (Signed)
CSN: [id number]     Arrival date & time 06/18/16  1317 History   First MD Initiated Contact with Patient 06/18/16 1523     Chief Complaint  Patient presents with  . Cough  . Otalgia   (Consider location/radiation/quality/duration/timing/severity/associated sxs/prior Treatment) 45 year old male complaining of headache for 15 days. The headache is in the back of the neck along the paracervical musculature and behind the eyes. His also complaining of head congestion and nasal congestion, head cold for 10 days. Is complaining of bilateral ear pain right greater than left and drainage from his ears. Positive for cough. The patient smokes and has been for several years. He states "I am going quit next week". He has not taken any medications for his symptoms. Past medical history includes COPD, EtOH abuse in the remote past, anxiety, hypertension, panic attacks and smokes one pack per day. He states he has not taken his temperature and does not believe he has had one and currently is 98.5.      Past Medical History:  Diagnosis Date  . Alcohol abuse   . Anxiety   . COPD (chronic obstructive pulmonary disease) ([place])   . Hypertension   . Panic attacks    Past Surgical History:  Procedure Laterality Date  . EXTERNAL EAR SURGERY    . EYE SURGERY     No family history on file. Social History  Substance Use Topics  . Smoking status: Current Every Day Smoker    Packs/day: 1.00  . Smokeless tobacco: Never Used  . Alcohol use No     Comment: quit 2 yrs ago    Review of Systems  Constitutional: Positive for activity change. Negative for diaphoresis, fatigue and fever.  HENT: Positive for congestion, ear discharge, ear pain, postnasal drip, rhinorrhea and sore throat. Negative for facial swelling and trouble swallowing.   Eyes: Negative for pain, discharge and redness.  Respiratory: Positive for cough. Negative for chest tightness and shortness of breath.   Cardiovascular: Negative.  Negative  for chest pain.  Gastrointestinal: Negative.   Musculoskeletal: Negative.  Negative for neck pain and neck stiffness.  Skin: Negative.   Neurological: Negative.     Allergies  Patient has no known allergies.  Home Medications   Prior to Admission medications   Medication Sig Start Date End Date Taking? Authorizing Provider  famotidine (PEPCID) 40 MG tablet Take 40 mg by mouth daily.   Yes Historical Provider, MD  gabapentin (NEURONTIN) 100 MG capsule Take 100 mg by mouth 3 (three) times daily.   Yes Historical Provider, MD  hydrOXYzine (VISTARIL) 50 MG capsule Take 50 mg by mouth 3 (three) times daily as needed.   Yes Historical Provider, MD  LISINOPRIL PO Take by mouth.   Yes Historical Provider, MD  LORazepam (ATIVAN) 1 MG tablet Take 1 tablet (1 mg total) by mouth 3 (three) times daily as needed for anxiety. 02/16/16  Yes Billy Fischer, MD  metoprolol (LOPRESSOR) 100 MG tablet Take 1 tablet (100 mg total) by mouth 2 (two) times daily. 02/17/16  Yes Tereasa Coop, PA-C  albuterol (PROVENTIL HFA;VENTOLIN HFA) 108 (90 Base) MCG/ACT inhaler Inhale 2 puffs into the lungs every 4 (four) hours as needed for wheezing or shortness of breath. 06/18/16   Janne Napoleon, NP  neomycin-polymyxin-hydrocortisone (CORTISPORIN) 3.5-10000-1 otic suspension Place 4 drops into both ears 4 (four) times daily. X 7 days 06/18/16   Janne Napoleon, NP  predniSONE (DELTASONE) 50 MG tablet 1 tab po daily for 6  days. Take with food. 06/18/16   Janne Napoleon, NP  traMADol (ULTRAM) 50 MG tablet Take 1 tablet (50 mg total) by mouth every 6 (six) hours as needed. 06/18/16   Janne Napoleon, NP   Meds Ordered and Administered this Visit  Medications - No data to display  BP (!) 158/114 Comment: Last decongestant yesterday; has taken HTN med this AM  Pulse 75   Temp 98.5 F (36.9 C) (Oral)   Resp 16   SpO2 96%  No data found.   Physical Exam  Constitutional: He is oriented to person, place, and time. He appears well-developed and  well-nourished. No distress.  HENT:  Head: Normocephalic and atraumatic.  Right Ear: External ear normal.  Left Ear: External ear normal.  Mouth/Throat: No oropharyngeal exudate.  Bilateral EACs are swollen and moist. Minor erythema and positive for tenderness. Difficult to visualize the TMs except for small areas.  Oropharynx with clear PND, minor streaky erythema and cobblestoning. No exudates.  Eyes: EOM are normal. Pupils are equal, round, and reactive to light.  Neck: Normal range of motion. Neck supple.  Cardiovascular: Normal rate, regular rhythm, normal heart sounds and intact distal pulses.   Pulmonary/Chest: Effort normal and breath sounds normal. No respiratory distress.  Bilateral expiratory coarseness. Occasional wheeze with forced expiration and cough.  Musculoskeletal: Normal range of motion. He exhibits no edema.  Lymphadenopathy:    He has no cervical adenopathy.  Neurological: He is alert and oriented to person, place, and time.  Skin: Skin is warm and dry.  Nursing note and vitals reviewed.   Urgent Care Course   Clinical Course     Procedures (including critical care time)  Labs Review Labs Reviewed - No data to display  Imaging Review No results found.   Visual Acuity Review  Right Eye Distance:   Left Eye Distance:   Bilateral Distance:    Right Eye Near:   Left Eye Near:    Bilateral Near:         MDM   1. PND (post-nasal drip)   2. Acute swimmer's ear of both sides   3. Rhinosinusitis   4. Bronchospasm   5. Chronic obstructive pulmonary disease, unspecified COPD type ([place])    using the albuterol inhaler correctly, it should help open your airways and help you breathe better and decrease cough. Stop smoking. Drink plenty of fluids and stay well-hydrated. Take the following medications as needed for upper respiratory infection symptoms. Sudafed PE 10 mg every 4 to 6 hours as needed for congestion Allegra or Zyrtec daily as needed for  drainage and runny nose. For stronger antihistamine may take Chlor-Trimeton 2 to 4 mg every 4 to 6 hours, may cause drowsiness. Saline nasal spray used frequently. Ibuprofen 600 mg every 6 hours as needed for pain, discomfort or fever. Drink plenty of fluids and stay well-hydrated. Meds ordered this encounter  Medications  . LISINOPRIL PO    Sig: Take by mouth.  Marland Kitchen albuterol (PROVENTIL HFA;VENTOLIN HFA) 108 (90 Base) MCG/ACT inhaler    Sig: Inhale 2 puffs into the lungs every 4 (four) hours as needed for wheezing or shortness of breath.    Dispense:  1 Inhaler    Refill:  0    Order Specific Question:   Supervising Provider    Answer:   Melony Overly [id number]  . predniSONE (DELTASONE) 50 MG tablet    Sig: 1 tab po daily for 6 days. Take with food.    Dispense:  6 tablet    Refill:  0    Order Specific Question:   Supervising Provider    Answer:   Melony Overly [id number]  . neomycin-polymyxin-hydrocortisone (CORTISPORIN) 3.5-10000-1 otic suspension    Sig: Place 4 drops into both ears 4 (four) times daily. X 7 days    Dispense:  7.5 mL    Refill:  0    Order Specific Question:   Supervising Provider    Answer:   Melony Overly [id number]  . traMADol (ULTRAM) 50 MG tablet    Sig: Take 1 tablet (50 mg total) by mouth every 6 (six) hours as needed.    Dispense:  15 tablet    Refill:  0    Order Specific Question:   Supervising Provider    Answer:   Melony Overly [phone]   Follow-up with your primary care doctor. Not feeling better in the next few days. Call for an appointment. Keep appointment scheduled in January.    Janne Napoleon, NP 06/18/16 [phone]

## 2016-07-15 ENCOUNTER — Ambulatory Visit (HOSPITAL_COMMUNITY)
Admission: EM | Admit: 2016-07-15 | Discharge: 2016-07-15 | Disposition: A | Discharge status: Home / Self Care | Payer: Medicare Other | Attending: Emergency Medicine | Admitting: Emergency Medicine

## 2016-07-15 ENCOUNTER — Encounter (HOSPITAL_COMMUNITY): Payer: Self-pay | Admitting: Emergency Medicine

## 2016-07-15 DIAGNOSIS — F410 Panic disorder [episodic paroxysmal anxiety] without agoraphobia: Secondary | ICD-10-CM

## 2016-07-15 DIAGNOSIS — Z760 Encounter for issue of repeat prescription: Secondary | ICD-10-CM

## 2016-07-15 MED ORDER — LORAZEPAM 2 MG/ML IJ SOLN
INTRAMUSCULAR | Status: AC
  Filled 2016-07-15: qty 1

## 2016-07-15 MED ORDER — LORAZEPAM 1 MG PO TABS: 1.0000 mg | ORAL_TABLET | Freq: Once | ORAL | Status: DC

## 2016-07-15 MED ORDER — LORAZEPAM 1 MG PO TABS: 1.0000 mg | ORAL_TABLET | Freq: Three times a day (TID) | ORAL | 0 refills | Status: DC | PRN

## 2016-07-15 MED ORDER — LORAZEPAM 2 MG/ML IJ SOLN
1.0000 mg | Freq: Once | INTRAMUSCULAR | Status: AC
  Administered 2016-07-15: 1 mg via INTRAMUSCULAR

## 2016-07-15 NOTE — ED Triage Notes (Signed)
Here for medication refill on his Ativan.... Last had it 2 days ago  Voices no other concerns  Has appt w/PCP on 1/2  A&O x4... NAD

## 2016-07-15 NOTE — ED Provider Notes (Signed)
CSN: [id number]     Arrival date & time 07/15/16  1302 History   First MD Initiated Contact with Patient 07/15/16 1530     Chief Complaint  Patient presents with  . Medication Refill   (Consider location/radiation/quality/duration/timing/severity/associated sxs/prior Treatment) 45 y.o. male presents for medication refill of ativan that he uses for panic attacks. Patient states that he last took the medication 2 days prior and had an appoinment on 1.2.18 with his doctor for refill. Patient appears anxious and states "I feel like I am going through withdrawal" patient denies any nausea vomitting or vision issues.        Past Medical History:  Diagnosis Date  . Alcohol abuse   . Anxiety   . COPD (chronic obstructive pulmonary disease) ([place])   . Hypertension   . Panic attacks    Past Surgical History:  Procedure Laterality Date  . EXTERNAL EAR SURGERY    . EYE SURGERY     History reviewed. No pertinent family history. Social History  Substance Use Topics  . Smoking status: Current Every Day Smoker    Packs/day: 1.00    Types: Cigarettes  . Smokeless tobacco: Never Used  . Alcohol use Yes     Comment: quit 2 yrs ago    Review of Systems  Constitutional: Negative for chills and fever.  HENT: Negative for ear pain and sore throat.   Eyes: Negative for pain and visual disturbance.  Respiratory: Negative for cough and shortness of breath.   Cardiovascular: Negative for chest pain and palpitations.  Gastrointestinal: Negative for abdominal pain and vomiting.  Genitourinary: Negative for dysuria and hematuria.  Musculoskeletal: Negative for arthralgias and back pain.  Skin: Negative for color change and rash.  Neurological: Negative for seizures and syncope.  All other systems reviewed and are negative.   Allergies  Patient has no known allergies.  Home Medications   Prior to Admission medications   Medication Sig Start Date End Date Taking? Authorizing Provider   albuterol (PROVENTIL HFA;VENTOLIN HFA) 108 (90 Base) MCG/ACT inhaler Inhale 2 puffs into the lungs every 4 (four) hours as needed for wheezing or shortness of breath. 06/18/16  Yes Janne Napoleon, NP  famotidine (PEPCID) 40 MG tablet Take 40 mg by mouth daily.   Yes Historical Provider, MD  gabapentin (NEURONTIN) 100 MG capsule Take 100 mg by mouth 3 (three) times daily.   Yes Historical Provider, MD  hydrOXYzine (VISTARIL) 50 MG capsule Take 50 mg by mouth 3 (three) times daily as needed.   Yes Historical Provider, MD  LISINOPRIL PO Take by mouth.   Yes Historical Provider, MD  metoprolol (LOPRESSOR) 100 MG tablet Take 1 tablet (100 mg total) by mouth 2 (two) times daily. 02/17/16  Yes Tereasa Coop, PA-C  LORazepam (ATIVAN) 1 MG tablet Take 1 tablet (1 mg total) by mouth 3 (three) times daily as needed for anxiety. 07/15/16   Jacqualine Mau, NP  neomycin-polymyxin-hydrocortisone (CORTISPORIN) 3.5-10000-1 otic suspension Place 4 drops into both ears 4 (four) times daily. X 7 days 06/18/16   Janne Napoleon, NP  predniSONE (DELTASONE) 50 MG tablet 1 tab po daily for 6 days. Take with food. 06/18/16   Janne Napoleon, NP  traMADol (ULTRAM) 50 MG tablet Take 1 tablet (50 mg total) by mouth every 6 (six) hours as needed. 06/18/16   Janne Napoleon, NP   Meds Ordered and Administered this Visit   Medications  LORazepam (ATIVAN) tablet 1 mg (not administered)    BP 134/88 (  BP Location: Right Arm)   Pulse 63   Temp 98.1 F (36.7 C) (Oral)   Resp 16   SpO2 97%  No data found.   Physical Exam  Constitutional: He is oriented to person, place, and time. He appears well-developed and well-nourished.  HENT:  Head: Normocephalic.  Neck: Normal range of motion.  Pulmonary/Chest: Effort normal.  Musculoskeletal: Normal range of motion.  Neurological: He is alert and oriented to person, place, and time.  Skin: Skin is dry.  Psychiatric: He has a normal mood and affect.  Nursing note and vitals  reviewed.   Urgent Care Course   Clinical Course     Procedures (including critical care time)  Labs Review Labs Reviewed - No data to display  Imaging Review No results found.         MDM   1. Medication refill      Jacqualine Mau, NP 07/15/16 1540

## 2016-07-18 DIAGNOSIS — I10 Essential (primary) hypertension: Secondary | ICD-10-CM | POA: Diagnosis not present

## 2016-07-18 DIAGNOSIS — F411 Generalized anxiety disorder: Secondary | ICD-10-CM | POA: Diagnosis not present

## 2016-07-26 ENCOUNTER — Encounter (HOSPITAL_COMMUNITY): Payer: Self-pay | Admitting: Emergency Medicine

## 2016-07-26 ENCOUNTER — Emergency Department (HOSPITAL_COMMUNITY): Payer: Medicare Other

## 2016-07-26 ENCOUNTER — Emergency Department (HOSPITAL_COMMUNITY)
Admission: EM | Admit: 2016-07-26 | Discharge: 2016-07-26 | Disposition: A | Discharge status: Home / Self Care | Payer: Medicare Other | Attending: Emergency Medicine | Admitting: Emergency Medicine

## 2016-07-26 DIAGNOSIS — M545 Low back pain: Secondary | ICD-10-CM | POA: Diagnosis not present

## 2016-07-26 DIAGNOSIS — Z79899 Other long term (current) drug therapy: Secondary | ICD-10-CM | POA: Diagnosis not present

## 2016-07-26 DIAGNOSIS — Y939 Activity, unspecified: Secondary | ICD-10-CM | POA: Diagnosis not present

## 2016-07-26 DIAGNOSIS — J449 Chronic obstructive pulmonary disease, unspecified: Secondary | ICD-10-CM | POA: Diagnosis not present

## 2016-07-26 DIAGNOSIS — M791 Myalgia, unspecified site: Secondary | ICD-10-CM

## 2016-07-26 DIAGNOSIS — M542 Cervicalgia: Secondary | ICD-10-CM | POA: Diagnosis not present

## 2016-07-26 DIAGNOSIS — F17210 Nicotine dependence, cigarettes, uncomplicated: Secondary | ICD-10-CM | POA: Diagnosis not present

## 2016-07-26 DIAGNOSIS — S4991XA Unspecified injury of right shoulder and upper arm, initial encounter: Secondary | ICD-10-CM | POA: Diagnosis not present

## 2016-07-26 DIAGNOSIS — S3992XA Unspecified injury of lower back, initial encounter: Secondary | ICD-10-CM | POA: Diagnosis not present

## 2016-07-26 DIAGNOSIS — M25511 Pain in right shoulder: Secondary | ICD-10-CM | POA: Insufficient documentation

## 2016-07-26 DIAGNOSIS — I10 Essential (primary) hypertension: Secondary | ICD-10-CM | POA: Insufficient documentation

## 2016-07-26 DIAGNOSIS — Y92410 Unspecified street and highway as the place of occurrence of the external cause: Secondary | ICD-10-CM | POA: Insufficient documentation

## 2016-07-26 DIAGNOSIS — T148XXA Other injury of unspecified body region, initial encounter: Secondary | ICD-10-CM | POA: Diagnosis not present

## 2016-07-26 DIAGNOSIS — Y999 Unspecified external cause status: Secondary | ICD-10-CM | POA: Diagnosis not present

## 2016-07-26 DIAGNOSIS — R030 Elevated blood-pressure reading, without diagnosis of hypertension: Secondary | ICD-10-CM

## 2016-07-26 MED ORDER — IBUPROFEN 600 MG PO TABS: 600.0000 mg | ORAL_TABLET | Freq: Four times a day (QID) | ORAL | 0 refills | Status: DC | PRN

## 2016-07-26 MED ORDER — METHOCARBAMOL 500 MG PO TABS: 500.0000 mg | ORAL_TABLET | Freq: Two times a day (BID) | ORAL | 0 refills | Status: DC | PRN
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
## 2016-07-26 NOTE — ED Notes (Signed)
Declined W/C at D/C and was escorted to lobby by RN. 
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
## 2016-07-31 ENCOUNTER — Ambulatory Visit (HOSPITAL_COMMUNITY)
Admission: EM | Admit: 2016-07-31 | Discharge: 2016-07-31 | Disposition: A | Discharge status: Home / Self Care | Payer: Medicare Other | Attending: Family Medicine | Admitting: Family Medicine

## 2016-07-31 ENCOUNTER — Encounter (HOSPITAL_COMMUNITY): Payer: Self-pay | Admitting: Emergency Medicine

## 2016-07-31 DIAGNOSIS — S161XXA Strain of muscle, fascia and tendon at neck level, initial encounter: Secondary | ICD-10-CM | POA: Diagnosis not present

## 2016-07-31 DIAGNOSIS — S39012A Strain of muscle, fascia and tendon of lower back, initial encounter: Secondary | ICD-10-CM

## 2016-07-31 MED ORDER — CYCLOBENZAPRINE HCL 10 MG PO TABS: 10.0000 mg | ORAL_TABLET | Freq: Every day | ORAL | 0 refills | Status: DC

## 2016-07-31 MED ORDER — NAPROXEN 500 MG PO TABS
500.0000 mg | ORAL_TABLET | Freq: Two times a day (BID) | ORAL | 0 refills | Status: DC
Start: 2016-07-31 — End: 2016-09-08

## 2016-07-31 MED ORDER — NAPROXEN 500 MG PO TABS: 500.0000 mg | ORAL_TABLET | Freq: Two times a day (BID) | ORAL | 0 refills | Status: DC
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
## 2016-08-08 ENCOUNTER — Emergency Department (HOSPITAL_COMMUNITY)
Admission: EM | Admit: 2016-08-08 | Discharge: 2016-08-09 | Disposition: A | Discharge status: Home / Self Care | Payer: Medicare Other | Attending: Emergency Medicine | Admitting: Emergency Medicine

## 2016-08-08 ENCOUNTER — Encounter (HOSPITAL_COMMUNITY): Payer: Self-pay | Admitting: Emergency Medicine

## 2016-08-08 DIAGNOSIS — G44209 Tension-type headache, unspecified, not intractable: Secondary | ICD-10-CM | POA: Diagnosis not present

## 2016-08-08 DIAGNOSIS — J449 Chronic obstructive pulmonary disease, unspecified: Secondary | ICD-10-CM | POA: Insufficient documentation

## 2016-08-08 DIAGNOSIS — F17210 Nicotine dependence, cigarettes, uncomplicated: Secondary | ICD-10-CM | POA: Diagnosis not present

## 2016-08-08 DIAGNOSIS — I10 Essential (primary) hypertension: Secondary | ICD-10-CM | POA: Insufficient documentation

## 2016-08-08 DIAGNOSIS — R51 Headache: Secondary | ICD-10-CM | POA: Diagnosis present

## 2016-08-08 MED ORDER — KETOROLAC TROMETHAMINE 15 MG/ML IJ SOLN
15.0000 mg | Freq: Once | INTRAMUSCULAR | Status: AC
  Administered 2016-08-08: 15 mg via INTRAVENOUS
  Filled 2016-08-08: qty 1

## 2016-08-08 MED ORDER — METOCLOPRAMIDE HCL 5 MG/ML IJ SOLN
10.0000 mg | Freq: Once | INTRAMUSCULAR | Status: AC
  Administered 2016-08-08: 10 mg via INTRAVENOUS
  Filled 2016-08-08: qty 2

## 2016-08-08 MED ORDER — DIPHENHYDRAMINE HCL 50 MG/ML IJ SOLN
12.5000 mg | Freq: Once | INTRAMUSCULAR | Status: AC
  Administered 2016-08-08: 12.5 mg via INTRAVENOUS
  Filled 2016-08-08: qty 1
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
## 2016-09-08 ENCOUNTER — Emergency Department (HOSPITAL_COMMUNITY)
Admission: EM | Admit: 2016-09-08 | Discharge: 2016-09-08 | Disposition: A | Discharge status: Home / Self Care | Payer: Medicare Other | Attending: Emergency Medicine | Admitting: Emergency Medicine

## 2016-09-08 ENCOUNTER — Emergency Department (HOSPITAL_COMMUNITY): Payer: Medicare Other

## 2016-09-08 ENCOUNTER — Encounter (HOSPITAL_COMMUNITY): Payer: Self-pay

## 2016-09-08 DIAGNOSIS — F17210 Nicotine dependence, cigarettes, uncomplicated: Secondary | ICD-10-CM | POA: Insufficient documentation

## 2016-09-08 DIAGNOSIS — I10 Essential (primary) hypertension: Secondary | ICD-10-CM | POA: Diagnosis not present

## 2016-09-08 DIAGNOSIS — R0789 Other chest pain: Secondary | ICD-10-CM | POA: Diagnosis not present

## 2016-09-08 DIAGNOSIS — J449 Chronic obstructive pulmonary disease, unspecified: Secondary | ICD-10-CM | POA: Diagnosis not present

## 2016-09-08 DIAGNOSIS — F419 Anxiety disorder, unspecified: Secondary | ICD-10-CM | POA: Diagnosis not present

## 2016-09-08 DIAGNOSIS — R079 Chest pain, unspecified: Secondary | ICD-10-CM

## 2016-09-08 DIAGNOSIS — Z7982 Long term (current) use of aspirin: Secondary | ICD-10-CM | POA: Diagnosis not present

## 2016-09-08 LAB — BASIC METABOLIC PANEL
ANION GAP: 11 (ref 5–15)
BUN: 13 mg/dL (ref 6–20)
CALCIUM: 9.7 mg/dL (ref 8.9–10.3)
CHLORIDE: 100 mmol/L — AB (ref 101–111)
CO2: 28 mmol/L (ref 22–32)
Creatinine, Ser: 0.92 mg/dL (ref 0.61–1.24)
GFR calc Af Amer: >60 mL/min (ref >60)
GFR calc non Af Amer: >60 mL/min (ref >60)
Glucose, Bld: 115 mg/dL — ABNORMAL HIGH (ref 65–99)
Potassium: 4.1 mmol/L (ref 3.5–5.1)
Sodium: 139 mmol/L (ref 135–145)

## 2016-09-08 LAB — CBC
HCT: 47.6 % (ref 39.0–52.0)
HEMOGLOBIN: 16.5 g/dL (ref 13.0–17.0)
MCH: 32.1 pg (ref 26.0–34.0)
MCHC: 34.7 g/dL (ref 30.0–36.0)
MCV: 92.6 fL (ref 78.0–100.0)
Platelets: 277 10*3/uL (ref 150–400)
RBC: 5.14 MIL/uL (ref 4.22–5.81)
RDW: 12.7 % (ref 11.5–15.5)
WBC: 11.9 10*3/uL — ABNORMAL HIGH (ref 4.0–10.5)

## 2016-09-08 LAB — I-STAT TROPONIN, ED
Troponin i, poc: 0 ng/mL (ref 0.00–0.08)
Troponin i, poc: 0 ng/mL (ref 0.00–0.08)

## 2016-09-08 MED ORDER — CHLORDIAZEPOXIDE HCL 5 MG PO CAPS
5.0000 mg | ORAL_CAPSULE | Freq: Once | ORAL | Status: AC
  Administered 2016-09-08: 5 mg via ORAL
  Filled 2016-09-08: qty 1

## 2016-09-08 MED ORDER — CHLORDIAZEPOXIDE HCL 5 MG PO CAPS: 5.0000 mg | ORAL_CAPSULE | Freq: Three times a day (TID) | ORAL | 0 refills | Status: AC

## 2016-09-08 MED ORDER — CHLORDIAZEPOXIDE HCL 5 MG PO CAPS
5.0000 mg | ORAL_CAPSULE | Freq: Three times a day (TID) | ORAL | 0 refills | Status: DC
Start: 2016-09-08 — End: 2016-09-08

## 2016-09-08 NOTE — ED Provider Notes (Signed)
[facility] DEPT Provider Note   CSN: [id number] Arrival date & time: 09/08/16  1422     History   Chief Complaint Chief Complaint  Patient presents with  . Withdrawal  . Chest Pain    HPI Vincent Johnson is a 46 y.o. male.  HPI   46 year old male with a history of anxiety, COPD, hypertension, panic attacks presents with concern for anxiety and chest pain. Reports he's been out of his Ativan for the past few days, and since then he "feels like I am dying", reports that he cannot focus, he feels extremely anxious and shaky. He been taking Ativan for 10 years, however ran out 3 days ago. Reports his physician would fill his prescription on February 27. Reports he doesn't feel like the Ativan is helping his anxiety anymore. Reports that late last night he began to develop chest pain, which she described as a tightness in the center of his chest without radiation. Reports associated mild nausea. Denies dyspnea. Denies known history of cardiac disease.   Past Medical History:  Diagnosis Date  . Alcohol abuse   . Anxiety   . COPD (chronic obstructive pulmonary disease) ([place])   . Hypertension   . Panic attacks     There are no active problems to display for this patient.   Past Surgical History:  Procedure Laterality Date  . EXTERNAL EAR SURGERY    . EYE SURGERY         Home Medications    Prior to Admission medications   Medication Sig Start Date End Date Taking? Authorizing Provider  albuterol (PROVENTIL HFA;VENTOLIN HFA) 108 (90 Base) MCG/ACT inhaler Inhale 2 puffs into the lungs every 4 (four) hours as needed for wheezing or shortness of breath. 06/18/16  Yes Janne Napoleon, NP  Aspirin-Salicylamide-Caffeine (BC FAST PAIN RELIEF) [phone] MG PACK Take 1 Package by mouth daily as needed (pain).   Yes Historical Provider, MD  famotidine (PEPCID) 40 MG tablet Take 40 mg by mouth daily.   Yes Historical Provider, MD  hydrOXYzine (VISTARIL) 50 MG capsule Take 50 mg by  mouth 3 (three) times daily as needed for anxiety.    Yes Historical Provider, MD  lisinopril (PRINIVIL,ZESTRIL) 20 MG tablet Take 20 mg by mouth daily.   Yes Historical Provider, MD  LORazepam (ATIVAN) 1 MG tablet Take 1 tablet (1 mg total) by mouth 3 (three) times daily as needed for anxiety. 07/15/16  Yes Jacqualine Mau, NP  metoprolol (LOPRESSOR) 100 MG tablet Take 1 tablet (100 mg total) by mouth 2 (two) times daily. 02/17/16  Yes Tereasa Coop, PA-C  neomycin-polymyxin-hydrocortisone (CORTISPORIN) 3.5-10000-1 otic suspension Place 4 drops into both ears 4 (four) times daily. X 7 days 06/18/16  Yes Janne Napoleon, NP    Family History No family history on file.  Social History Social History  Substance Use Topics  . Smoking status: Current Every Day Smoker    Packs/day: 1.00    Types: Cigarettes  . Smokeless tobacco: Never Used  . Alcohol use Yes     Comment: quit 2 yrs ago     Allergies   Patient has no known allergies.   Review of Systems Review of Systems  Constitutional: Negative for fever.  HENT: Negative for sore throat.   Eyes: Negative for visual disturbance.  Respiratory: Negative for shortness of breath.   Cardiovascular: Positive for chest pain.  Gastrointestinal: Positive for nausea. Negative for abdominal pain.  Genitourinary: Negative for difficulty urinating.  Musculoskeletal: Negative for back  pain and neck stiffness.  Skin: Negative for rash.  Neurological: Negative for syncope and headaches.     Physical Exam Updated Vital Signs BP (!) 139/103   Pulse 71   Temp 97.8 F (36.6 C) (Oral)   Resp 17   Ht 5\' 11"  (1.803 m)   Wt 199 lb (90.3 kg)   SpO2 98%   BMI 27.75 kg/m   Physical Exam  Constitutional: He is oriented to person, place, and time. He appears well-developed and well-nourished. No distress.  HENT:  Head: Normocephalic and atraumatic.  Eyes: Conjunctivae and EOM are normal.  Neck: Normal range of motion.  Cardiovascular: Normal  rate, regular rhythm, normal heart sounds and intact distal pulses.  Exam reveals no gallop and no friction rub.   No murmur heard. Pulmonary/Chest: Effort normal and breath sounds normal. No respiratory distress. He has no wheezes. He has no rales.  Abdominal: Soft. He exhibits no distension. There is no tenderness. There is no guarding.  Musculoskeletal: He exhibits no edema.  Neurological: He is alert and oriented to person, place, and time.  Skin: Skin is warm and dry. He is not diaphoretic.  Nursing note and vitals reviewed.    ED Treatments / Results  Labs (all labs ordered are listed, but only abnormal results are displayed) Labs Reviewed  BASIC METABOLIC PANEL - Abnormal; Notable for the following:       Result Value   Chloride 100 (*)    Glucose, Bld 115 (*)    All other components within normal limits  CBC - Abnormal; Notable for the following:    WBC 11.9 (*)    All other components within normal limits  I-STAT TROPOININ, ED  Randolm Idol, ED    EKG  EKG Interpretation None       Radiology Dg Chest 2 View  Result Date: 09/08/2016 CLINICAL DATA:  Progressive chest tightness for several days. EXAM: CHEST  2 VIEW COMPARISON:  Chest x-ray dated 03/13/2016 and 08/10/2010 FINDINGS: The heart size and mediastinal contours are within normal limits. Both lungs are clear. The visualized skeletal structures are unremarkable. IMPRESSION: Normal chest. Electronically Signed   By: Lorriane Shire M.D.   On: 09/08/2016 15:33    Procedures Procedures (including critical care time)  Medications Ordered in ED Medications  chlordiazePOXIDE (LIBRIUM) capsule 5 mg (5 mg Oral Given 09/08/16 1734)     Initial Impression / Assessment and Plan / ED Course  I have reviewed the triage vital signs and the nursing notes.  Pertinent labs & imaging results that were available during my care of the patient were reviewed by me and considered in my medical decision making (see chart  for details).     46 year old male with a history of anxiety, COPD, hypertension, panic attacks presents with concern for anxiety and chest pain.  EKG shows no acute abnormalities. Delta troponins negative. Have low suspicion for pulmonary embolus, and patient is PERC negative. He has normal bilateral pulses, normal chest x-ray and doubt aortic dissection.  Patient is appropriate for outpatient evaluation of CP, and is low risk HEART score.  He does have hypertension, and sensation of anxiety, which may be consistent with Ativan withdrawal. Discussed that we're unable to fill his Ativan prescription in the emergency department, however will provide Librium for withdrawal symptoms. Recommend PCP follow up. Patient discharged in stable condition with understanding of reasons to return.   Final Clinical Impressions(s) / ED Diagnoses   Final diagnoses:  Chest pain, unspecified type  New Prescriptions New Prescriptions   No medications on file     Gareth Morgan, MD 09/09/16 [phone]

## 2016-09-08 NOTE — ED Triage Notes (Signed)
Per Pt, Pt is coming from home with complaints of cotton mouth, shaking, and "feeling like I am dying." Pt reports stopping Ativan three days ago after taking medication for ten years. Pt is prescribed medication for anxiety, but has been taking more than prescribed and running out. Pt reports chest tightness and "mind racing."

## 2016-10-17 DIAGNOSIS — I10 Essential (primary) hypertension: Secondary | ICD-10-CM | POA: Diagnosis not present

## 2016-10-17 DIAGNOSIS — F411 Generalized anxiety disorder: Secondary | ICD-10-CM | POA: Diagnosis not present

## 2017-01-15 DIAGNOSIS — I10 Essential (primary) hypertension: Secondary | ICD-10-CM | POA: Diagnosis not present

## 2017-01-15 DIAGNOSIS — F411 Generalized anxiety disorder: Secondary | ICD-10-CM | POA: Diagnosis not present

## 2017-01-16 ENCOUNTER — Other Ambulatory Visit: Payer: Self-pay | Admitting: Physician Assistant

## 2017-01-16 DIAGNOSIS — E279 Disorder of adrenal gland, unspecified: Secondary | ICD-10-CM

## 2017-01-16 DIAGNOSIS — E278 Other specified disorders of adrenal gland: Secondary | ICD-10-CM

## 2017-01-16 DIAGNOSIS — K769 Liver disease, unspecified: Secondary | ICD-10-CM

## 2017-01-22 ENCOUNTER — Inpatient Hospital Stay
Admission: RE | Admit: 2017-01-22 | Discharge: 2017-01-22 | Disposition: A | Discharge status: Home / Self Care | Payer: Medicare Other | Source: Ambulatory Visit | Attending: Physician Assistant | Admitting: Physician Assistant

## 2017-02-07 ENCOUNTER — Ambulatory Visit (HOSPITAL_COMMUNITY)
Admission: EM | Admit: 2017-02-07 | Discharge: 2017-02-07 | Disposition: A | Discharge status: Home / Self Care | Payer: Medicare Other | Attending: Family Medicine | Admitting: Family Medicine

## 2017-02-07 ENCOUNTER — Encounter (HOSPITAL_COMMUNITY): Payer: Self-pay | Admitting: Emergency Medicine

## 2017-02-07 DIAGNOSIS — F419 Anxiety disorder, unspecified: Secondary | ICD-10-CM | POA: Diagnosis not present

## 2017-02-07 MED ORDER — LORAZEPAM 2 MG PO TABS: 2.0000 mg | ORAL_TABLET | Freq: Three times a day (TID) | ORAL | 0 refills | Status: DC | PRN

## 2017-02-07 NOTE — ED Provider Notes (Signed)
  Vincent Johnson   [id number] 02/07/17 Arrival Time: 1238  ASSESSMENT & PLAN:  1. Anxiety    Meds ordered this encounter  Medications  . LORazepam (ATIVAN) 2 MG tablet    Sig: Take 1 tablet (2 mg total) by mouth every 8 (eight) hours as needed for anxiety.    Dispense:  12 tablet    Refill:  0   Agrees to schedule f/u with his PCP to address continuing anxiety.  Reviewed expectations re: course of current medical issues. Questions answered. Outlined signs and symptoms indicating need for more acute intervention. Patient verbalized understanding. After Visit Summary given.   SUBJECTIVE:  Vincent Johnson is a 46 y.o. male who presents with complaint of running out of Ativan. Takes TID daily. Overall feels Ativan not helping as much as it used to. No acute anxiety exacerbations. Last dose was 2 days ago. No withdrawal symptoms. Requests refill to get him through Sunday when he can fill his regular prescription. Admits to taking an extra Ativan every once in awhile when he feels he needs it.  ROS: As per HPI.   OBJECTIVE:  Vitals:   02/07/17 1303  BP: (!) 161/106  Pulse: 67  Resp: 18  Temp: 98.4 F (36.9 C)  TempSrc: Oral  SpO2: 100%     General appearance: alert; no distress Lungs: clear to auscultation bilaterally Heart: regular rate and rhythm Psychological:  alert and cooperative; normal mood and affect  No Known Allergies  PMHx, SurgHx, SocialHx, Medications, and Allergies were reviewed in the Visit Navigator and updated as appropriate.      Vincent Kick, MD 02/07/17 1322

## 2017-02-07 NOTE — ED Triage Notes (Signed)
Patient has run out of ativan.  Last had ativan 2 days ago

## 2017-03-10 ENCOUNTER — Ambulatory Visit (HOSPITAL_COMMUNITY)
Admission: EM | Admit: 2017-03-10 | Discharge: 2017-03-10 | Disposition: A | Discharge status: Home / Self Care | Payer: Medicare Other | Attending: Family Medicine | Admitting: Family Medicine

## 2017-03-10 ENCOUNTER — Encounter (HOSPITAL_COMMUNITY): Payer: Self-pay | Admitting: Emergency Medicine

## 2017-03-10 DIAGNOSIS — Z760 Encounter for issue of repeat prescription: Secondary | ICD-10-CM | POA: Diagnosis not present

## 2017-03-10 DIAGNOSIS — F418 Other specified anxiety disorders: Secondary | ICD-10-CM

## 2017-03-10 MED ORDER — LORAZEPAM 2 MG PO TABS: 2.0000 mg | ORAL_TABLET | Freq: Three times a day (TID) | ORAL | 0 refills | Status: AC | PRN

## 2017-03-10 NOTE — Discharge Instructions (Signed)
I have refilled your medicine for 1 day. Follow up with PCP for further refills/management. If experiencing worsening tremors, chest pain, shortness of breath, weakness, passing out, go to the emergency department for further evaluation.

## 2017-03-10 NOTE — ED Triage Notes (Signed)
Pt reports he is needing refill on his Ativan  Sts he went to pharmacy but they told him they were out of it???  Sts he is going through withdrawal sx... Last had Ativan 2 days ago.   BP today = 160/111... Denies CP, HA, blurred vision, dyspnea  A&O x4... NAD... Ambulatory.... Here w/wifre at bedside.

## 2017-03-10 NOTE — ED Provider Notes (Signed)
[facility]    CSN: [id number] Arrival date & time: 03/10/17  1745     History   Chief Complaint Chief Complaint  Patient presents with  . Medication Refill    HPI Vincent Johnson is a 46 y.o. male.   46 year old male with history of alcohol abuse, anxiety, COPD, hypertension, panic attacks, comes in for medication refill. Patient states he is out of his Ativan, has not taken for the past 2 days. He went to pharmacy for refill, and was told that they were out of the medication. He states he asked if prescription could be transferred to a different pharmacy, in which he was told he could not. States he is having anxiety, with tremors, and feels that he is in withdrawal. Denies trouble breathing, trouble swallowing, headache, chest pain, nausea, vomiting. Denies weakness, dizziness, syncope.       Past Medical History:  Diagnosis Date  . Alcohol abuse   . Anxiety   . COPD (chronic obstructive pulmonary disease) ([place])   . Hypertension   . Panic attacks     There are no active problems to display for this patient.   Past Surgical History:  Procedure Laterality Date  . EXTERNAL EAR SURGERY    . EYE SURGERY         Home Medications    Prior to Admission medications   Medication Sig Start Date End Date Taking? Authorizing Provider  lisinopril (PRINIVIL,ZESTRIL) 20 MG tablet Take 20 mg by mouth daily.   Yes [provider]  metoprolol (LOPRESSOR) 100 MG tablet Take 1 tablet (100 mg total) by mouth 2 (two) times daily. 02/17/16  Yes Tereasa Coop, PA-C  albuterol (PROVENTIL HFA;VENTOLIN HFA) 108 (90 Base) MCG/ACT inhaler Inhale 2 puffs into the lungs every 4 (four) hours as needed for wheezing or shortness of breath. 06/18/16   Janne Napoleon, NP  Aspirin-Salicylamide-Caffeine (BC FAST PAIN RELIEF) [phone] MG PACK Take 1 Package by mouth daily as needed (pain).    [provider]  famotidine (PEPCID) 40 MG tablet Take 40 mg by mouth daily.     [provider]  hydrOXYzine (VISTARIL) 50 MG capsule Take 50 mg by mouth 3 (three) times daily as needed for anxiety.     [provider]  LORazepam (ATIVAN) 2 MG tablet Take 1 tablet (2 mg total) by mouth every 8 (eight) hours as needed for anxiety. 03/10/17   Tasia Catchings, Amy V, PA-C  neomycin-polymyxin-hydrocortisone (CORTISPORIN) 3.5-10000-1 otic suspension Place 4 drops into both ears 4 (four) times daily. X 7 days 06/18/16   Janne Napoleon, NP    Family History No family history on file.  Social History Social History  Substance Use Topics  . Smoking status: Current Every Day Smoker    Packs/day: 1.00    Types: Cigarettes  . Smokeless tobacco: Never Used  . Alcohol use Yes     Comment: quit 2 yrs ago     Allergies   Patient has no known allergies.   Review of Systems Review of Systems  Reason unable to perform ROS: See HPI as above.     Physical Exam Triage Vital Signs ED Triage Vitals [03/10/17 1810]  Enc Vitals Group     BP (!) 160/111     Pulse Rate 65     Resp 20     Temp 97.7 F (36.5 C)     Temp Source Oral     SpO2 97 %     Weight  Height      Head Circumference      Peak Flow      Pain Score      Pain Loc      Pain Edu?      Excl. in [place]?    No data found.   Updated Vital Signs BP (!) 160/111 (BP Location: Left Arm)   Pulse 65   Temp 97.7 F (36.5 C) (Oral)   Resp 20   SpO2 97%      Physical Exam  Constitutional: He is oriented to person, place, and time. He appears well-developed and well-nourished. No distress.  HENT:  Head: Normocephalic and atraumatic.  Eyes: Pupils are equal, round, and reactive to light. Conjunctivae and EOM are normal.  Cardiovascular: Normal rate, regular rhythm and normal heart sounds.  Exam reveals no gallop and no friction rub.   No murmur heard. Pulmonary/Chest: Effort normal. He has no wheezes. He has no rales.  Neurological: He is alert and oriented to person, place, and time.  No obvious  tremor  Skin: Skin is warm and dry. He is not diaphoretic.     UC Treatments / Results  Labs (all labs ordered are listed, but only abnormal results are displayed) Labs Reviewed - No data to display  EKG  EKG Interpretation None       Radiology No results found.  Procedures Procedures (including critical care time)  Medications Ordered in UC Medications - No data to display   Initial Impression / Assessment and Plan / UC Course  I have reviewed the triage vital signs and the nursing notes.  Pertinent labs & imaging results that were available during my care of the patient were reviewed by me and considered in my medical decision making (see chart for details).    [place] controlled substance registry noted patient had one refill left of ativan from PCP.  Given patient's story matches up with registry, with long term use of ativan, feel that it is reasonable for refill through weekend for patient to follow up with PCP for further refills/management. Patient stable during exam without significant withdrawal symptoms such as tremor, nausea, vomiting, diaphoresis. Return precautions given.   Discussed case with attending, Dr Mannie Stabile, who agrees to plan.   Final Clinical Impressions(s) / UC Diagnoses   Final diagnoses:  Medication refill    New Prescriptions Discharge Medication List as of 03/10/2017  7:15 PM       Controlled Substance Prescriptions [place] Controlled Substance Registry consulted? Yes, I have consulted the  Controlled Substances Registry for this patient, and feel the risk/benefit ratio today is favorable for proceeding with this prescription for a controlled substance.   Ok Edwards, PA-C 03/10/17 2011    Ok Edwards, PA-C 03/10/17 2012

## 2017-04-09 DIAGNOSIS — I10 Essential (primary) hypertension: Secondary | ICD-10-CM | POA: Diagnosis not present

## 2017-04-09 DIAGNOSIS — F411 Generalized anxiety disorder: Secondary | ICD-10-CM | POA: Diagnosis not present

## 2017-05-04 DIAGNOSIS — F17200 Nicotine dependence, unspecified, uncomplicated: Secondary | ICD-10-CM | POA: Diagnosis not present

## 2017-05-04 DIAGNOSIS — I10 Essential (primary) hypertension: Secondary | ICD-10-CM | POA: Diagnosis not present

## 2017-05-04 DIAGNOSIS — F410 Panic disorder [episodic paroxysmal anxiety] without agoraphobia: Secondary | ICD-10-CM | POA: Diagnosis not present

## 2017-05-04 DIAGNOSIS — H9210 Otorrhea, unspecified ear: Secondary | ICD-10-CM | POA: Diagnosis not present

## 2017-05-07 DIAGNOSIS — Z6828 Body mass index (BMI) 28.0-28.9, adult: Secondary | ICD-10-CM | POA: Diagnosis not present

## 2017-05-07 DIAGNOSIS — J329 Chronic sinusitis, unspecified: Secondary | ICD-10-CM | POA: Diagnosis not present

## 2017-05-15 DIAGNOSIS — R05 Cough: Secondary | ICD-10-CM | POA: Diagnosis not present

## 2017-05-15 DIAGNOSIS — J209 Acute bronchitis, unspecified: Secondary | ICD-10-CM | POA: Diagnosis not present

## 2017-06-04 DIAGNOSIS — Z6827 Body mass index (BMI) 27.0-27.9, adult: Secondary | ICD-10-CM | POA: Diagnosis not present

## 2017-06-04 DIAGNOSIS — I10 Essential (primary) hypertension: Secondary | ICD-10-CM | POA: Diagnosis not present

## 2017-06-04 DIAGNOSIS — F410 Panic disorder [episodic paroxysmal anxiety] without agoraphobia: Secondary | ICD-10-CM | POA: Diagnosis not present

## 2017-06-04 DIAGNOSIS — F17200 Nicotine dependence, unspecified, uncomplicated: Secondary | ICD-10-CM | POA: Diagnosis not present

## 2017-06-04 DIAGNOSIS — R5381 Other malaise: Secondary | ICD-10-CM | POA: Diagnosis not present

## 2017-07-02 DIAGNOSIS — F410 Panic disorder [episodic paroxysmal anxiety] without agoraphobia: Secondary | ICD-10-CM | POA: Diagnosis not present

## 2017-07-02 DIAGNOSIS — I10 Essential (primary) hypertension: Secondary | ICD-10-CM | POA: Diagnosis not present

## 2017-07-26 DIAGNOSIS — F319 Bipolar disorder, unspecified: Secondary | ICD-10-CM | POA: Diagnosis not present

## 2017-07-26 DIAGNOSIS — I10 Essential (primary) hypertension: Secondary | ICD-10-CM | POA: Diagnosis not present

## 2017-08-08 DIAGNOSIS — Z6826 Body mass index (BMI) 26.0-26.9, adult: Secondary | ICD-10-CM | POA: Diagnosis not present

## 2017-08-08 DIAGNOSIS — E785 Hyperlipidemia, unspecified: Secondary | ICD-10-CM | POA: Diagnosis not present

## 2017-08-08 DIAGNOSIS — Z1331 Encounter for screening for depression: Secondary | ICD-10-CM | POA: Diagnosis not present

## 2017-08-08 DIAGNOSIS — F17200 Nicotine dependence, unspecified, uncomplicated: Secondary | ICD-10-CM | POA: Diagnosis not present

## 2017-08-08 DIAGNOSIS — Z0000 Encounter for general adult medical examination without abnormal findings: Secondary | ICD-10-CM | POA: Diagnosis not present

## 2017-08-08 DIAGNOSIS — I10 Essential (primary) hypertension: Secondary | ICD-10-CM | POA: Diagnosis not present

## 2017-08-08 DIAGNOSIS — F319 Bipolar disorder, unspecified: Secondary | ICD-10-CM | POA: Diagnosis not present

## 2017-08-08 DIAGNOSIS — Z9181 History of falling: Secondary | ICD-10-CM | POA: Diagnosis not present

## 2017-08-08 DIAGNOSIS — Z125 Encounter for screening for malignant neoplasm of prostate: Secondary | ICD-10-CM | POA: Diagnosis not present

## 2017-08-08 DIAGNOSIS — Z136 Encounter for screening for cardiovascular disorders: Secondary | ICD-10-CM | POA: Diagnosis not present

## 2017-09-24 DIAGNOSIS — H50012 Monocular esotropia, left eye: Secondary | ICD-10-CM | POA: Diagnosis not present

## 2017-09-24 DIAGNOSIS — H5500 Unspecified nystagmus: Secondary | ICD-10-CM | POA: Diagnosis not present

## 2017-09-24 DIAGNOSIS — H2513 Age-related nuclear cataract, bilateral: Secondary | ICD-10-CM | POA: Diagnosis not present

## 2017-09-24 DIAGNOSIS — D2310 Other benign neoplasm of skin of unspecified eyelid, including canthus: Secondary | ICD-10-CM | POA: Diagnosis not present

## 2017-10-04 DIAGNOSIS — H02822 Cysts of right lower eyelid: Secondary | ICD-10-CM | POA: Diagnosis not present

## 2017-10-08 DIAGNOSIS — Z79899 Other long term (current) drug therapy: Secondary | ICD-10-CM | POA: Diagnosis not present

## 2017-10-08 DIAGNOSIS — E559 Vitamin D deficiency, unspecified: Secondary | ICD-10-CM | POA: Diagnosis not present

## 2017-10-08 DIAGNOSIS — R350 Frequency of micturition: Secondary | ICD-10-CM | POA: Diagnosis not present

## 2017-10-08 DIAGNOSIS — I10 Essential (primary) hypertension: Secondary | ICD-10-CM | POA: Diagnosis not present

## 2017-10-08 DIAGNOSIS — R5383 Other fatigue: Secondary | ICD-10-CM | POA: Diagnosis not present

## 2017-10-08 DIAGNOSIS — M545 Low back pain: Secondary | ICD-10-CM | POA: Diagnosis not present

## 2017-10-08 DIAGNOSIS — E785 Hyperlipidemia, unspecified: Secondary | ICD-10-CM | POA: Diagnosis not present

## 2017-10-08 DIAGNOSIS — E119 Type 2 diabetes mellitus without complications: Secondary | ICD-10-CM | POA: Diagnosis not present

## 2017-10-08 DIAGNOSIS — K219 Gastro-esophageal reflux disease without esophagitis: Secondary | ICD-10-CM | POA: Diagnosis not present

## 2017-10-08 DIAGNOSIS — F419 Anxiety disorder, unspecified: Secondary | ICD-10-CM | POA: Diagnosis not present

## 2017-10-08 DIAGNOSIS — G894 Chronic pain syndrome: Secondary | ICD-10-CM | POA: Diagnosis not present

## 2017-10-10 DIAGNOSIS — E559 Vitamin D deficiency, unspecified: Secondary | ICD-10-CM | POA: Diagnosis not present

## 2017-10-10 DIAGNOSIS — E119 Type 2 diabetes mellitus without complications: Secondary | ICD-10-CM | POA: Diagnosis not present

## 2017-10-10 DIAGNOSIS — I10 Essential (primary) hypertension: Secondary | ICD-10-CM | POA: Diagnosis not present

## 2017-10-10 DIAGNOSIS — R002 Palpitations: Secondary | ICD-10-CM | POA: Diagnosis not present

## 2017-10-10 DIAGNOSIS — R350 Frequency of micturition: Secondary | ICD-10-CM | POA: Diagnosis not present

## 2017-10-10 DIAGNOSIS — E785 Hyperlipidemia, unspecified: Secondary | ICD-10-CM | POA: Diagnosis not present

## 2017-10-10 DIAGNOSIS — R5383 Other fatigue: Secondary | ICD-10-CM | POA: Diagnosis not present

## 2018-03-06 DIAGNOSIS — I10 Essential (primary) hypertension: Secondary | ICD-10-CM | POA: Diagnosis not present

## 2018-03-06 DIAGNOSIS — F319 Bipolar disorder, unspecified: Secondary | ICD-10-CM | POA: Diagnosis not present

## 2018-03-06 DIAGNOSIS — F410 Panic disorder [episodic paroxysmal anxiety] without agoraphobia: Secondary | ICD-10-CM | POA: Diagnosis not present

## 2018-03-06 DIAGNOSIS — Z79899 Other long term (current) drug therapy: Secondary | ICD-10-CM | POA: Diagnosis not present
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
## 2018-05-12 DIAGNOSIS — R457 State of emotional shock and stress, unspecified: Secondary | ICD-10-CM | POA: Diagnosis not present

## 2018-05-12 DIAGNOSIS — F29 Unspecified psychosis not due to a substance or known physiological condition: Secondary | ICD-10-CM | POA: Diagnosis not present

## 2020-05-07 DIAGNOSIS — Z2821 Immunization not carried out because of patient refusal: Secondary | ICD-10-CM | POA: Diagnosis not present

## 2020-05-07 DIAGNOSIS — Z23 Encounter for immunization: Secondary | ICD-10-CM | POA: Diagnosis not present

## 2020-05-07 DIAGNOSIS — I10 Essential (primary) hypertension: Secondary | ICD-10-CM | POA: Diagnosis not present

## 2020-05-07 DIAGNOSIS — Z79899 Other long term (current) drug therapy: Secondary | ICD-10-CM | POA: Diagnosis not present

## 2020-07-30 DIAGNOSIS — Z79899 Other long term (current) drug therapy: Secondary | ICD-10-CM | POA: Diagnosis not present

## 2020-07-30 DIAGNOSIS — I10 Essential (primary) hypertension: Secondary | ICD-10-CM | POA: Diagnosis not present

## 2020-08-27 DIAGNOSIS — Z79899 Other long term (current) drug therapy: Secondary | ICD-10-CM | POA: Diagnosis not present

## 2020-08-27 DIAGNOSIS — I10 Essential (primary) hypertension: Secondary | ICD-10-CM | POA: Diagnosis not present

## 2020-08-27 DIAGNOSIS — Z2821 Immunization not carried out because of patient refusal: Secondary | ICD-10-CM | POA: Diagnosis not present

## 2020-08-27 DIAGNOSIS — Z23 Encounter for immunization: Secondary | ICD-10-CM | POA: Diagnosis not present

## 2020-09-20 DIAGNOSIS — T485X5A Adverse effect of other anti-common-cold drugs, initial encounter: Secondary | ICD-10-CM | POA: Diagnosis not present

## 2020-09-20 DIAGNOSIS — F17210 Nicotine dependence, cigarettes, uncomplicated: Secondary | ICD-10-CM | POA: Diagnosis not present

## 2020-09-20 DIAGNOSIS — J310 Chronic rhinitis: Secondary | ICD-10-CM | POA: Diagnosis not present

## 2020-09-20 DIAGNOSIS — H9313 Tinnitus, bilateral: Secondary | ICD-10-CM | POA: Diagnosis not present

## 2020-09-20 DIAGNOSIS — H6123 Impacted cerumen, bilateral: Secondary | ICD-10-CM | POA: Diagnosis not present

## 2020-09-20 DIAGNOSIS — Z9889 Other specified postprocedural states: Secondary | ICD-10-CM | POA: Diagnosis not present

## 2020-10-12 DIAGNOSIS — Z79899 Other long term (current) drug therapy: Secondary | ICD-10-CM | POA: Diagnosis not present

## 2020-11-04 DIAGNOSIS — R5383 Other fatigue: Secondary | ICD-10-CM | POA: Diagnosis not present

## 2020-11-04 DIAGNOSIS — R059 Cough, unspecified: Secondary | ICD-10-CM | POA: Diagnosis not present

## 2020-11-04 DIAGNOSIS — G4733 Obstructive sleep apnea (adult) (pediatric): Secondary | ICD-10-CM | POA: Diagnosis not present

## 2020-11-04 DIAGNOSIS — F17210 Nicotine dependence, cigarettes, uncomplicated: Secondary | ICD-10-CM | POA: Diagnosis not present

## 2020-11-12 DIAGNOSIS — I10 Essential (primary) hypertension: Secondary | ICD-10-CM | POA: Diagnosis not present

## 2020-11-12 DIAGNOSIS — Z79899 Other long term (current) drug therapy: Secondary | ICD-10-CM | POA: Diagnosis not present

## 2020-12-14 DIAGNOSIS — I10 Essential (primary) hypertension: Secondary | ICD-10-CM | POA: Diagnosis not present

## 2020-12-14 DIAGNOSIS — Z79899 Other long term (current) drug therapy: Secondary | ICD-10-CM | POA: Diagnosis not present

## 2021-01-06 DIAGNOSIS — Z760 Encounter for issue of repeat prescription: Secondary | ICD-10-CM | POA: Diagnosis not present

## 2021-02-09 DIAGNOSIS — Z79899 Other long term (current) drug therapy: Secondary | ICD-10-CM | POA: Diagnosis not present

## 2021-03-03 DIAGNOSIS — J069 Acute upper respiratory infection, unspecified: Secondary | ICD-10-CM | POA: Diagnosis not present

## 2021-04-18 DIAGNOSIS — H9012 Conductive hearing loss, unilateral, left ear, with unrestricted hearing on the contralateral side: Secondary | ICD-10-CM | POA: Diagnosis not present

## 2021-04-18 DIAGNOSIS — H6122 Impacted cerumen, left ear: Secondary | ICD-10-CM | POA: Diagnosis not present

## 2021-04-18 DIAGNOSIS — H90A12 Conductive hearing loss, unilateral, left ear with restricted hearing on the contralateral side: Secondary | ICD-10-CM | POA: Diagnosis not present

## 2021-04-23 DIAGNOSIS — L0231 Cutaneous abscess of buttock: Secondary | ICD-10-CM | POA: Diagnosis not present

## 2021-05-10 DIAGNOSIS — Z760 Encounter for issue of repeat prescription: Secondary | ICD-10-CM | POA: Diagnosis not present

## 2021-05-10 DIAGNOSIS — M545 Low back pain, unspecified: Secondary | ICD-10-CM | POA: Diagnosis not present

## 2021-05-16 DIAGNOSIS — H90A12 Conductive hearing loss, unilateral, left ear with restricted hearing on the contralateral side: Secondary | ICD-10-CM | POA: Diagnosis not present

## 2021-05-16 DIAGNOSIS — H9012 Conductive hearing loss, unilateral, left ear, with unrestricted hearing on the contralateral side: Secondary | ICD-10-CM | POA: Diagnosis not present

## 2021-05-18 DIAGNOSIS — H905 Unspecified sensorineural hearing loss: Secondary | ICD-10-CM | POA: Diagnosis not present

## 2021-05-19 DIAGNOSIS — H7102 Cholesteatoma of attic, left ear: Secondary | ICD-10-CM | POA: Diagnosis not present

## 2021-05-19 DIAGNOSIS — H6122 Impacted cerumen, left ear: Secondary | ICD-10-CM | POA: Diagnosis not present

## 2021-05-19 DIAGNOSIS — R42 Dizziness and giddiness: Secondary | ICD-10-CM | POA: Diagnosis not present

## 2021-05-21 ENCOUNTER — Ambulatory Visit (HOSPITAL_COMMUNITY)
Admission: EM | Admit: 2021-05-21 | Discharge: 2021-05-21 | Disposition: A | Discharge status: Home / Self Care | Payer: Medicare Other

## 2021-05-21 ENCOUNTER — Encounter (HOSPITAL_COMMUNITY): Payer: Self-pay | Admitting: Emergency Medicine

## 2021-05-21 ENCOUNTER — Emergency Department (HOSPITAL_COMMUNITY)
Admission: EM | Admit: 2021-05-21 | Discharge: 2021-05-21 | Disposition: A | Discharge status: Home / Self Care | Payer: Medicare Other | Attending: Emergency Medicine | Admitting: Emergency Medicine

## 2021-05-21 ENCOUNTER — Other Ambulatory Visit: Payer: Self-pay

## 2021-05-21 DIAGNOSIS — I10 Essential (primary) hypertension: Secondary | ICD-10-CM | POA: Diagnosis not present

## 2021-05-21 DIAGNOSIS — J449 Chronic obstructive pulmonary disease, unspecified: Secondary | ICD-10-CM | POA: Diagnosis not present

## 2021-05-21 DIAGNOSIS — G8929 Other chronic pain: Secondary | ICD-10-CM | POA: Insufficient documentation

## 2021-05-21 DIAGNOSIS — Z79899 Other long term (current) drug therapy: Secondary | ICD-10-CM | POA: Diagnosis not present

## 2021-05-21 DIAGNOSIS — F419 Anxiety disorder, unspecified: Secondary | ICD-10-CM | POA: Insufficient documentation

## 2021-05-21 DIAGNOSIS — F17210 Nicotine dependence, cigarettes, uncomplicated: Secondary | ICD-10-CM | POA: Insufficient documentation

## 2021-05-21 MED ORDER — LORAZEPAM 1 MG PO TABS
1.0000 mg | ORAL_TABLET | Freq: Once | ORAL | Status: AC
  Administered 2021-05-21: 1 mg via ORAL
  Filled 2021-05-21: qty 1

## 2021-05-21 MED ORDER — IBUPROFEN 400 MG PO TABS
400.0000 mg | ORAL_TABLET | Freq: Once | ORAL | Status: AC
  Administered 2021-05-21: 400 mg via ORAL
  Filled 2021-05-21: qty 1

## 2021-05-21 MED ORDER — METHOCARBAMOL 500 MG PO TABS: 500.0000 mg | ORAL_TABLET | Freq: Two times a day (BID) | ORAL | 0 refills | Status: AC

## 2021-05-21 NOTE — ED Notes (Signed)
Pt seems a little anxious.  When asked if he wanted the ativan that was ordered in triage he said yes.  He also complains of back pain from an injury a month or so ago.  He states he has been taking 400mg  ibuprofen to manage it.  I will administer that dose per approval of [employer].

## 2021-05-21 NOTE — ED Triage Notes (Signed)
Pt reports feeling anxious over the past 2-3 days.  States he received a letter yesterday that he was being dismissed from PCP office because he missed an appt and had disagreement about medication.  Denies pain other than chronic lower back pain.

## 2021-05-21 NOTE — ED Provider Notes (Signed)
Emergency Medicine Provider Triage Evaluation Note  Vincent Johnson , a 50 y.o. male  was evaluated in triage.  Pt complains of anxiety / panic attack x 3 days. History of same. Normally takes ativan prn, but recently dismissed from provider, struggling to find a psychiatrist. No chest pain, shortness of breath. Endorses sleep trouble, racing thoughts. No SI/HI/AVH.  Review of Systems  Positive: As above Negative: As above  Physical Exam  There were no vitals taken for this visit. Gen:   Awake, no distress   Resp:  Normal effort  MSK:   Moves extremities without difficulty  Other:  Some red splotchy rash around the neck without papules, vesicles  Medical Decision Making  Medically screening exam initiated at 4:36 PM.  Appropriate orders placed.  Vincent Johnson was informed that the remainder of the evaluation will be completed by another provider, this initial triage assessment does not replace that evaluation, and the importance of remaining in the ED until their evaluation is complete.  Anxiety attack   Vincent Johnson 05/21/21 1637    [name], MD 05/21/21 1801

## 2021-06-02 NOTE — ED Provider Notes (Signed)
[facility] EMERGENCY DEPARTMENT Provider Note   CSN: [id number] Arrival date & time: 05/21/21  1551     History Chief Complaint  Patient presents with   Anxiety    Vincent Johnson is a 50 y.o. male.  HPI     50yo male presents with concern for increasing anxiety over the last 2-3 days and request for medication refill.  Denies SI. Denies any other symptoms or concerns.  He will be flying to [facility] and would like medication for the flight and is nervous about not being established with a prescriber when he arrives. Reports some chronic pain but denies any acute medical concerns  Past Medical History:  Diagnosis Date   Alcohol abuse    Anxiety    COPD (chronic obstructive pulmonary disease) (HCC)    Hypertension    Panic attacks     There are no problems to display for this patient.   Past Surgical History:  Procedure Laterality Date   EXTERNAL EAR SURGERY     EYE SURGERY         No family history on file.  Social History   Tobacco Use   Smoking status: Every Day    Packs/day: 1.00    Types: Cigarettes   Smokeless tobacco: Never  Substance Use Topics   Alcohol use: Yes    Comment: quit 2 yrs ago   Drug use: No    Home Medications Prior to Admission medications   Medication Sig Start Date End Date Taking? Authorizing Provider  methocarbamol (ROBAXIN) 500 MG tablet Take 1 tablet (500 mg total) by mouth 2 (two) times daily. 05/21/21  Yes Gareth Morgan, MD  albuterol (PROVENTIL HFA;VENTOLIN HFA) 108 (90 Base) MCG/ACT inhaler Inhale 2 puffs into the lungs every 4 (four) hours as needed for wheezing or shortness of breath. 06/18/16   Janne Napoleon, NP  Aspirin-Salicylamide-Caffeine (BC FAST PAIN RELIEF) [phone] MG PACK Take 1 Package by mouth daily as needed (pain).    [provider]  famotidine (PEPCID) 40 MG tablet Take 40 mg by mouth daily.    [provider]  hydrOXYzine (VISTARIL) 50 MG capsule Take 50 mg by  mouth 3 (three) times daily as needed for anxiety.     [provider]  lisinopril (PRINIVIL,ZESTRIL) 20 MG tablet Take 20 mg by mouth daily.    [provider]  LORazepam (ATIVAN) 2 MG tablet Take 1 tablet (2 mg total) by mouth every 8 (eight) hours as needed for anxiety. 03/10/17   Tasia Catchings, Amy V, PA-C  metoprolol (LOPRESSOR) 100 MG tablet Take 1 tablet (100 mg total) by mouth 2 (two) times daily. 02/17/16   Tereasa Coop, PA-C  neomycin-polymyxin-hydrocortisone (CORTISPORIN) 3.5-10000-1 otic suspension Place 4 drops into both ears 4 (four) times daily. X 7 days 06/18/16   Janne Napoleon, NP    Allergies    Patient has no known allergies.  Review of Systems   Review of Systems  Constitutional:  Negative for fever.  Cardiovascular:  Negative for chest pain.  Psychiatric/Behavioral:  Negative for suicidal ideas. The patient is nervous/anxious.    Physical Exam Updated Vital Signs BP (!) 127/103 (BP Location: Right Arm)   Pulse 67   Temp 97.6 F (36.4 C) (Oral)   Resp 17   Ht 5\' 11"  (1.803 m)   Wt 86.2 kg   SpO2 98%   BMI 26.50 kg/m   Physical Exam Vitals and nursing note reviewed.  Constitutional:  General: He is not in acute distress.    Appearance: Normal appearance. He is not ill-appearing, toxic-appearing or diaphoretic.  HENT:     Head: Normocephalic.  Eyes:     Conjunctiva/sclera: Conjunctivae normal.  Cardiovascular:     Rate and Rhythm: Normal rate and regular rhythm.     Pulses: Normal pulses.  Pulmonary:     Effort: Pulmonary effort is normal. No respiratory distress.  Musculoskeletal:        General: No deformity or signs of injury.     Cervical back: No rigidity.  Skin:    General: Skin is warm and dry.     Coloration: Skin is not jaundiced or pale.  Neurological:     General: No focal deficit present.     Mental Status: He is alert and oriented to person, place, and time.    ED Results / Procedures / Treatments   Labs (all labs ordered  are listed, but only abnormal results are displayed) Labs Reviewed - No data to display  EKG None  Radiology No results found.  Procedures Procedures   Medications Ordered in ED Medications  LORazepam (ATIVAN) tablet 1 mg (1 mg Oral Given 05/21/21 1857)  ibuprofen (ADVIL) tablet 400 mg (400 mg Oral Given 05/21/21 1857)    ED Course  I have reviewed the triage vital signs and the nursing notes.  Pertinent labs & imaging results that were available during my care of the patient were reviewed by me and considered in my medical decision making (see chart for details).    MDM Rules/Calculators/A&P                            50yo male presents with concern for increasing anxiety over the last 2-3 days and request for medication refill.   Discussed we are not able to give scheduled medication refills per protocol, But can given rx for methocarbamol. Recommend continued follow up with a PCP.   Final Clinical Impression(s) / ED Diagnoses Final diagnoses:  Anxiety    Rx / DC Orders ED Discharge Orders          Ordered    methocarbamol (ROBAXIN) 500 MG tablet  2 times daily        05/21/21 2214             Gareth Morgan, MD 06/02/21 0020

## 2021-06-08 DIAGNOSIS — Z760 Encounter for issue of repeat prescription: Secondary | ICD-10-CM | POA: Diagnosis not present

## 2021-06-08 DIAGNOSIS — I10 Essential (primary) hypertension: Secondary | ICD-10-CM | POA: Diagnosis not present

## 2021-06-13 DIAGNOSIS — Z0000 Encounter for general adult medical examination without abnormal findings: Secondary | ICD-10-CM | POA: Diagnosis not present

## 2021-06-13 DIAGNOSIS — I10 Essential (primary) hypertension: Secondary | ICD-10-CM | POA: Diagnosis not present

## 2021-06-16 DIAGNOSIS — Z0000 Encounter for general adult medical examination without abnormal findings: Secondary | ICD-10-CM | POA: Diagnosis not present

## 2021-06-16 DIAGNOSIS — F17210 Nicotine dependence, cigarettes, uncomplicated: Secondary | ICD-10-CM | POA: Diagnosis not present

## 2021-06-16 DIAGNOSIS — Z79899 Other long term (current) drug therapy: Secondary | ICD-10-CM | POA: Diagnosis not present

## 2021-06-16 DIAGNOSIS — Z87891 Personal history of nicotine dependence: Secondary | ICD-10-CM | POA: Diagnosis not present

## 2021-06-16 DIAGNOSIS — E559 Vitamin D deficiency, unspecified: Secondary | ICD-10-CM | POA: Diagnosis not present

## 2021-06-16 DIAGNOSIS — Z1159 Encounter for screening for other viral diseases: Secondary | ICD-10-CM | POA: Diagnosis not present

## 2021-06-16 DIAGNOSIS — Z79891 Long term (current) use of opiate analgesic: Secondary | ICD-10-CM | POA: Diagnosis not present

## 2021-06-21 DIAGNOSIS — M25519 Pain in unspecified shoulder: Secondary | ICD-10-CM | POA: Diagnosis not present

## 2021-06-21 DIAGNOSIS — Z789 Other specified health status: Secondary | ICD-10-CM | POA: Diagnosis not present

## 2021-06-21 DIAGNOSIS — E559 Vitamin D deficiency, unspecified: Secondary | ICD-10-CM | POA: Diagnosis not present

## 2021-06-21 DIAGNOSIS — R7303 Prediabetes: Secondary | ICD-10-CM | POA: Diagnosis not present

## 2021-06-21 DIAGNOSIS — Z79899 Other long term (current) drug therapy: Secondary | ICD-10-CM | POA: Diagnosis not present

## 2021-07-06 DIAGNOSIS — I10 Essential (primary) hypertension: Secondary | ICD-10-CM | POA: Diagnosis not present

## 2021-07-06 DIAGNOSIS — Z79899 Other long term (current) drug therapy: Secondary | ICD-10-CM | POA: Diagnosis not present

## 2021-07-06 DIAGNOSIS — R7303 Prediabetes: Secondary | ICD-10-CM | POA: Diagnosis not present

## 2021-07-06 DIAGNOSIS — Z0130 Encounter for examination of blood pressure without abnormal findings: Secondary | ICD-10-CM | POA: Diagnosis not present

## 2021-11-19 DIAGNOSIS — J9801 Acute bronchospasm: Secondary | ICD-10-CM | POA: Diagnosis not present
